# Patient Record
Sex: Female | Born: 1955 | Race: White | Hispanic: No | Smoking: Current every day smoker
Health system: Southern US, Community
[De-identification: ages and names within clinical notes are randomized; demographics above are authoritative.]

## PROBLEM LIST (undated history)

## (undated) DIAGNOSIS — T7840XA Allergy, unspecified, initial encounter: Secondary | ICD-10-CM

## (undated) DIAGNOSIS — F17200 Nicotine dependence, unspecified, uncomplicated: Secondary | ICD-10-CM

## (undated) DIAGNOSIS — F32A Depression, unspecified: Secondary | ICD-10-CM

## (undated) DIAGNOSIS — R87619 Unspecified abnormal cytological findings in specimens from cervix uteri: Secondary | ICD-10-CM

## (undated) DIAGNOSIS — Z86010 Personal history of colonic polyps: Secondary | ICD-10-CM

## (undated) DIAGNOSIS — D219 Benign neoplasm of connective and other soft tissue, unspecified: Secondary | ICD-10-CM

## (undated) DIAGNOSIS — F419 Anxiety disorder, unspecified: Secondary | ICD-10-CM

## (undated) DIAGNOSIS — F329 Major depressive disorder, single episode, unspecified: Secondary | ICD-10-CM

## (undated) HISTORY — PX: COLONOSCOPY: SHX174

## (undated) HISTORY — DX: Nicotine dependence, unspecified, uncomplicated: F17.200

## (undated) HISTORY — DX: Depression, unspecified: F32.A

## (undated) HISTORY — DX: Unspecified abnormal cytological findings in specimens from cervix uteri: R87.619

## (undated) HISTORY — DX: Personal history of colonic polyps: Z86.010

## (undated) HISTORY — DX: Benign neoplasm of connective and other soft tissue, unspecified: D21.9

## (undated) HISTORY — DX: Major depressive disorder, single episode, unspecified: F32.9

## (undated) HISTORY — DX: Allergy, unspecified, initial encounter: T78.40XA

## (undated) HISTORY — PX: POLYPECTOMY: SHX149

## (undated) HISTORY — DX: Anxiety disorder, unspecified: F41.9

---

## 1987-10-19 HISTORY — PX: CHOLECYSTECTOMY, LAPAROSCOPIC: SHX56

## 2000-04-27 ENCOUNTER — Other Ambulatory Visit: Admission: RE | Admit: 2000-04-27 | Discharge: 2000-04-27 | Payer: Self-pay | Admitting: Obstetrics and Gynecology

## 2000-05-18 DIAGNOSIS — D219 Benign neoplasm of connective and other soft tissue, unspecified: Secondary | ICD-10-CM

## 2000-05-18 HISTORY — DX: Benign neoplasm of connective and other soft tissue, unspecified: D21.9

## 2001-06-13 ENCOUNTER — Other Ambulatory Visit: Admission: RE | Admit: 2001-06-13 | Discharge: 2001-06-13 | Payer: Self-pay | Admitting: Obstetrics and Gynecology

## 2002-06-21 ENCOUNTER — Other Ambulatory Visit: Admission: RE | Admit: 2002-06-21 | Discharge: 2002-06-21 | Payer: Self-pay | Admitting: Obstetrics and Gynecology

## 2002-06-29 ENCOUNTER — Encounter: Payer: Self-pay | Admitting: Obstetrics and Gynecology

## 2002-06-29 ENCOUNTER — Ambulatory Visit (HOSPITAL_COMMUNITY): Admission: RE | Admit: 2002-06-29 | Discharge: 2002-06-29 | Payer: Self-pay | Admitting: Obstetrics and Gynecology

## 2003-09-10 ENCOUNTER — Other Ambulatory Visit: Admission: RE | Admit: 2003-09-10 | Discharge: 2003-09-10 | Payer: Self-pay | Admitting: Obstetrics and Gynecology

## 2004-10-16 ENCOUNTER — Encounter: Admission: RE | Admit: 2004-10-16 | Discharge: 2004-10-16 | Payer: Self-pay | Admitting: Obstetrics and Gynecology

## 2004-10-16 ENCOUNTER — Other Ambulatory Visit: Admission: RE | Admit: 2004-10-16 | Discharge: 2004-10-16 | Payer: Self-pay | Admitting: Obstetrics and Gynecology

## 2005-10-18 HISTORY — PX: FINGER SURGERY: SHX640

## 2005-12-03 ENCOUNTER — Other Ambulatory Visit: Admission: RE | Admit: 2005-12-03 | Discharge: 2005-12-03 | Payer: Self-pay | Admitting: Obstetrics and Gynecology

## 2006-11-09 ENCOUNTER — Ambulatory Visit (HOSPITAL_BASED_OUTPATIENT_CLINIC_OR_DEPARTMENT_OTHER): Admission: RE | Admit: 2006-11-09 | Discharge: 2006-11-09 | Payer: Self-pay | Admitting: Orthopedic Surgery

## 2006-12-27 ENCOUNTER — Other Ambulatory Visit: Admission: RE | Admit: 2006-12-27 | Discharge: 2006-12-27 | Payer: Self-pay | Admitting: Obstetrics & Gynecology

## 2007-01-05 ENCOUNTER — Encounter: Admission: RE | Admit: 2007-01-05 | Discharge: 2007-01-05 | Payer: Self-pay | Admitting: Obstetrics and Gynecology

## 2008-01-12 ENCOUNTER — Other Ambulatory Visit: Admission: RE | Admit: 2008-01-12 | Discharge: 2008-01-12 | Payer: Self-pay | Admitting: Obstetrics and Gynecology

## 2008-02-08 ENCOUNTER — Encounter: Admission: RE | Admit: 2008-02-08 | Discharge: 2008-02-08 | Payer: Self-pay | Admitting: Obstetrics and Gynecology

## 2009-01-13 ENCOUNTER — Other Ambulatory Visit: Admission: RE | Admit: 2009-01-13 | Discharge: 2009-01-13 | Payer: Self-pay | Admitting: Obstetrics and Gynecology

## 2009-12-16 ENCOUNTER — Ambulatory Visit: Payer: Self-pay | Admitting: Diagnostic Radiology

## 2009-12-16 ENCOUNTER — Emergency Department (HOSPITAL_BASED_OUTPATIENT_CLINIC_OR_DEPARTMENT_OTHER): Admission: EM | Admit: 2009-12-16 | Discharge: 2009-12-16 | Payer: Self-pay | Admitting: Emergency Medicine

## 2010-02-23 ENCOUNTER — Encounter: Admission: RE | Admit: 2010-02-23 | Discharge: 2010-02-23 | Payer: Self-pay | Admitting: Obstetrics and Gynecology

## 2010-02-26 ENCOUNTER — Encounter: Admission: RE | Admit: 2010-02-26 | Discharge: 2010-02-26 | Payer: Self-pay | Admitting: Obstetrics and Gynecology

## 2011-01-11 LAB — DIFFERENTIAL
Basophils Absolute: 0.1 10*3/uL (ref 0.0–0.1)
Basophils Relative: 2 % — ABNORMAL HIGH (ref 0–1)
Eosinophils Absolute: 0.2 10*3/uL (ref 0.0–0.7)
Eosinophils Relative: 4 % (ref 0–5)
Lymphocytes Relative: 33 % (ref 12–46)
Lymphs Abs: 1.9 10*3/uL (ref 0.7–4.0)
Monocytes Absolute: 0.2 10*3/uL (ref 0.1–1.0)
Monocytes Relative: 4 % (ref 3–12)
Neutro Abs: 3.5 10*3/uL (ref 1.7–7.7)
Neutrophils Relative %: 58 % (ref 43–77)

## 2011-01-11 LAB — CBC
HCT: 42.7 % (ref 36.0–46.0)
Hemoglobin: 14.8 g/dL (ref 12.0–15.0)
MCHC: 34.7 g/dL (ref 30.0–36.0)
MCV: 90.5 fL (ref 78.0–100.0)
Platelets: 260 10*3/uL (ref 150–400)
RBC: 4.71 MIL/uL (ref 3.87–5.11)
RDW: 12 % (ref 11.5–15.5)
WBC: 5.9 10*3/uL (ref 4.0–10.5)

## 2011-01-11 LAB — BASIC METABOLIC PANEL
BUN: 8 mg/dL (ref 6–23)
CO2: 28 mEq/L (ref 19–32)
Calcium: 9.7 mg/dL (ref 8.4–10.5)
Chloride: 108 mEq/L (ref 96–112)
Creatinine, Ser: 0.9 mg/dL (ref 0.4–1.2)
GFR calc Af Amer: >60 mL/min (ref >60)
GFR calc non Af Amer: >60 mL/min (ref >60)
Glucose, Bld: 99 mg/dL (ref 70–99)
Potassium: 3.6 mEq/L (ref 3.5–5.1)
Sodium: 144 mEq/L (ref 135–145)

## 2011-01-11 LAB — POCT CARDIAC MARKERS
CKMB, poc: <1 ng/mL — ABNORMAL LOW (ref 1.0–8.0)
CKMB, poc: <1 ng/mL — ABNORMAL LOW (ref 1.0–8.0)
Myoglobin, poc: 28.3 ng/mL (ref 12–200)
Myoglobin, poc: 30.2 ng/mL (ref 12–200)
Troponin i, poc: <0.05 ng/mL (ref 0.00–0.09)
Troponin i, poc: <0.05 ng/mL (ref 0.00–0.09)

## 2011-01-11 LAB — D-DIMER, QUANTITATIVE: D-Dimer, Quant: <0.22 ug/mL-FEU (ref 0.00–0.48)

## 2011-03-05 NOTE — Op Note (Signed)
Cassandra Lynch, Cassandra Lynch             ACCOUNT NO.:  [id number]   MEDICAL RECORD NO.:  [id number]          PATIENT TYPE:  AMB   LOCATION:  DSC                          FACILITY:  [facility]   PHYSICIAN:  Cindee Salt, M.D.       DATE OF BIRTH:  Mar 13, 1956   DATE OF PROCEDURE:  11/09/2006  DATE OF DISCHARGE:                               OPERATIVE REPORT   PREOPERATIVE DIAGNOSIS:  Laceration, mallet deformity, right index  finger.   POSTOPERATIVE DIAGNOSIS:  Laceration, mallet deformity, right index  finger.   OPERATION:  Repair of extensor tendon, pinning of distal interphalangeal  joint, right index finger.   SURGEON:  Merlyn Lot, M.D.   ASSISTANT:  Carolyne Fiscal R.N.   ANESTHESIA:  Forearm-based IV regional.   HISTORY:  The patient is a 55 year old female who suffered a laceration  over the middle phalanx of her right index finger with a subsequent  mallet deformity.  She is admitted for pinning and repair the extensor  tendon.  In the preoperative area, the patient is seen, the finger  marked by both patient and surgeon, questions encouraged and answered.   PROCEDURE:  The patient was brought to the operating room where a  forearm-based IV regional anesthetic was carried out without difficulty.  She was prepped using DuraPrep, supine position, right arm free.  The  laceration was opened, extended proximally and distally.  Laceration of  the extensor tendon was visualized.  The distal interphalangeal joint  was then pinned with a 3.5 K-wire under image intensification.  This was  done after irrigation of the joint.  The extensor tendon was then  repaired with figure-of-eight 5-0 Mersilene sutures.  The skin was  repaired with interrupted 5-0 nylon sutures.  Sterile compressive  dressing and splint to the finger applied.  The patient tolerated the  procedure well and was taken to the recovery room for observation in  satisfactory condition.  She is discharged home to return to the [facility] in  [place] in 1 week on Vicodin.           ______________________________  Cindee Salt, M.D.     GK/MEDQ  D:  11/09/2006  T:  11/09/2006  Job:  [id number]

## 2011-04-12 ENCOUNTER — Ambulatory Visit
Admission: RE | Admit: 2011-04-12 | Discharge: 2011-04-12 | Disposition: A | Discharge status: Home / Self Care | Payer: BC Managed Care – PPO | Source: Ambulatory Visit | Attending: Obstetrics and Gynecology | Admitting: Obstetrics and Gynecology

## 2011-04-12 ENCOUNTER — Other Ambulatory Visit: Payer: Self-pay | Admitting: Obstetrics and Gynecology

## 2011-04-12 DIAGNOSIS — Z1231 Encounter for screening mammogram for malignant neoplasm of breast: Secondary | ICD-10-CM

## 2012-06-16 LAB — HM PAP SMEAR: HM Pap smear: NEGATIVE

## 2013-06-19 ENCOUNTER — Encounter: Payer: Self-pay | Admitting: Nurse Practitioner

## 2013-06-19 ENCOUNTER — Ambulatory Visit: Payer: Self-pay | Admitting: Nurse Practitioner

## 2013-07-13 ENCOUNTER — Ambulatory Visit (INDEPENDENT_AMBULATORY_CARE_PROVIDER_SITE_OTHER): Payer: BC Managed Care – PPO | Admitting: Nurse Practitioner

## 2013-07-13 ENCOUNTER — Encounter: Payer: Self-pay | Admitting: Nurse Practitioner

## 2013-07-13 VITALS — BP 142/88 | HR 92 | Resp 16 | Ht 64.75 in | Wt 165.0 lb

## 2013-07-13 DIAGNOSIS — Z01419 Encounter for gynecological examination (general) (routine) without abnormal findings: Secondary | ICD-10-CM

## 2013-07-13 DIAGNOSIS — Z0000 Encounter for general adult medical examination without abnormal findings: Secondary | ICD-10-CM

## 2013-07-13 LAB — POCT URINALYSIS DIPSTICK
Bilirubin, UA: NEGATIVE
Blood, UA: NEGATIVE
Glucose, UA: NEGATIVE
Ketones, UA: NEGATIVE
Leukocytes, UA: NEGATIVE
Nitrite, UA: NEGATIVE
Urobilinogen, UA: NEGATIVE
pH, UA: 5.5

## 2013-07-13 LAB — HEMOGLOBIN, FINGERSTICK: Hemoglobin, fingerstick: 17.2 g/dL — ABNORMAL HIGH (ref 12.0–16.0)

## 2013-07-13 NOTE — Progress Notes (Signed)
Patient ID: Cassandra Lynch, female   DOB: [birth year], 57 y.o.   MRN: [id number] 57 y.o. [id number] Divorced Caucasian Fe here for annual exam.  She and ex husband have now worked out the details of divorce and she is much happier.  Less stress. Daughter is still away at school. She is now living with her sister.  She is working on doing things for herself and plans to quit smoking this year. Not dating.  No LMP recorded. Patient is postmenopausal.          Sexually active: no  The current method of family planning is abstinence.    Exercising: no  The patient does not participate in regular exercise at present. Smoker:  yes  Health Maintenance: Pap:  06/16/12, WNL with negative HR HPV MMG:  04/13/11, BI-Rads 1: negative Colonoscopy:  Never but plans are to get done this year. BMD:   02/08/08, osteopenia TDaP:  10/2006 Labs: HB: 17.2 Urine: negative    reports that she has been smoking Cigarettes.  She has a 30 pack-year smoking history. She has never used smokeless tobacco. She reports that  drinks alcohol. She reports that she does not use illicit drugs.  History reviewed. No pertinent past medical history.  Past Surgical History  Procedure Laterality Date  . Cholecystectomy, laparoscopic    . Cesarean section      x 2  . Finger surgery Right 2007    pointer, severed tendons    Current Outpatient Prescriptions  Medication Sig Dispense Refill  . ALPRAZolam (XANAX) 0.25 MG tablet Take 0.25 mg by mouth as needed for sleep.      . ARIPiprazole (ABILIFY) 5 MG tablet Take 5 mg by mouth daily.      . Desloratadine-Pseudoephedrine (CLARINEX-D 12 HOUR PO) Take 1 tablet by mouth 2 (two) times daily.      Marland Kitchen lamoTRIgine (LAMICTAL) 150 MG tablet Take 150 mg by mouth daily.      Marland Kitchen venlafaxine XR (EFFEXOR-XR) 75 MG 24 hr capsule Take 225 mg by mouth daily.       No current facility-administered medications for this visit.    Family History  Problem Relation Age of Onset  . Colon polyps Father    . Colon polyps Brother     ROS:  Pertinent items are noted in HPI.  Otherwise, a comprehensive ROS was negative.  Exam:   BP 142/88  Pulse 92  Resp 16  Ht 5' 4.75" (1.645 m)  Wt 165 lb (74.844 kg)  BMI 27.66 kg/m2 Height: 5' 4.75" (164.5 cm)  Ht Readings from Last 3 Encounters:  07/13/13 5' 4.75" (1.645 m)    General appearance: alert, cooperative and appears stated age Head: Normocephalic, without obvious abnormality, atraumatic Neck: no adenopathy, supple, symmetrical, trachea midline and thyroid normal to inspection and palpation Lungs: clear to auscultation bilaterally Breasts: normal appearance, no masses or tenderness Heart: regular rate and rhythm Abdomen: soft, non-tender; no masses,  no organomegaly Extremities: extremities normal, atraumatic, no cyanosis or edema Skin: Skin color, texture, turgor normal. No rashes or lesions Lymph nodes: Cervical, supraclavicular, and axillary nodes normal. No abnormal inguinal nodes palpated Neurologic: Grossly normal   Pelvic: External genitalia:  no lesions              Urethra:  normal appearing urethra with no masses, tenderness or lesions              Bartholin's and Skene's: normal  Vagina: normal appearing vagina with normal color and discharge, no lesions              Cervix: anteverted              Pap taken: no Bimanual Exam:  Uterus:  normal size, contour, position, consistency, mobility, non-tender              Adnexa: no mass, fullness, tenderness               Rectovaginal: Confirms               Anus:  normal sphincter tone, no lesions  A:  Well Woman with normal exam  Postmenopausal  Situational stress is improved with divorce  P:   Pap smear as per guidelines   Mammogram due now and will schedule  She will return for fasting labs  Counseled on breast self exam, adequate intake of calcium and vitamin D, diet and exercise, Kegel's exercises return annually or prn  An After Visit Summary was  printed and given to the patient.

## 2013-07-13 NOTE — Patient Instructions (Addendum)

## 2013-07-16 NOTE — Progress Notes (Signed)
Encounter reviewed by Dr. Brook Silva.  

## 2013-07-27 ENCOUNTER — Other Ambulatory Visit: Payer: BC Managed Care – PPO

## 2013-07-31 ENCOUNTER — Other Ambulatory Visit (INDEPENDENT_AMBULATORY_CARE_PROVIDER_SITE_OTHER): Payer: BC Managed Care – PPO

## 2013-07-31 DIAGNOSIS — Z0000 Encounter for general adult medical examination without abnormal findings: Secondary | ICD-10-CM

## 2013-08-01 LAB — COMPREHENSIVE METABOLIC PANEL
ALT: 19 U/L (ref 0–35)
AST: 15 U/L (ref 0–37)
Albumin: 4.3 g/dL (ref 3.5–5.2)
BUN: 11 mg/dL (ref 6–23)
Calcium: 9.7 mg/dL (ref 8.4–10.5)
Chloride: 103 mEq/L (ref 96–112)
Creat: 0.81 mg/dL (ref 0.50–1.10)
Glucose, Bld: 100 mg/dL — ABNORMAL HIGH (ref 70–99)
Potassium: 4.2 mEq/L (ref 3.5–5.3)
Sodium: 138 mEq/L (ref 135–145)
Total Protein: 6.7 g/dL (ref 6.0–8.3)

## 2013-08-01 LAB — LIPID PANEL
Cholesterol: 209 mg/dL — ABNORMAL HIGH (ref 0–200)
HDL: 38 mg/dL — ABNORMAL LOW (ref >39)
LDL Cholesterol: 133 mg/dL — ABNORMAL HIGH (ref 0–99)
Total CHOL/HDL Ratio: 5.5 Ratio
Triglycerides: 192 mg/dL — ABNORMAL HIGH (ref <150)

## 2013-08-01 LAB — VITAMIN D 25 HYDROXY (VIT D DEFICIENCY, FRACTURES): Vit D, 25-Hydroxy: 24 ng/mL — ABNORMAL LOW (ref 30–89)

## 2013-08-06 ENCOUNTER — Telehealth: Payer: Self-pay | Admitting: Nurse Practitioner

## 2013-08-06 NOTE — Telephone Encounter (Signed)
Spoke with pt about lab results. Advised on cholesterol panel, triglycerides, TSH, and Vit D. Advised pt that thyroid is low and she should see her PCP for possible medication and management. Advised pt to take 600-800 IU of Vit D daily. Advised pt that decreasing sweets and starches and daily walking could lower triglycerides. Pt agreeable.

## 2013-08-06 NOTE — Telephone Encounter (Signed)
Patient is calling to check on her blood work results from last Tuesday. Please advise.

## 2013-08-07 ENCOUNTER — Other Ambulatory Visit: Payer: Self-pay

## 2013-08-07 DIAGNOSIS — Z1231 Encounter for screening mammogram for malignant neoplasm of breast: Secondary | ICD-10-CM

## 2013-08-14 ENCOUNTER — Ambulatory Visit (AMBULATORY_SURGERY_CENTER): Payer: Self-pay | Admitting: *Deleted

## 2013-08-14 ENCOUNTER — Encounter: Payer: Self-pay | Admitting: Internal Medicine

## 2013-08-14 VITALS — Ht 64.5 in | Wt 163.2 lb

## 2013-08-14 DIAGNOSIS — Z1211 Encounter for screening for malignant neoplasm of colon: Secondary | ICD-10-CM

## 2013-08-14 MED ORDER — NA SULFATE-K SULFATE-MG SULF 17.5-3.13-1.6 GM/177ML PO SOLN: 1.0000 | Freq: Once | ORAL | Status: DC

## 2013-08-14 NOTE — Progress Notes (Signed)
Denies allergies to eggs or soy products. Denies complications with sedation or anesthesia. 

## 2013-08-15 ENCOUNTER — Telehealth: Payer: Self-pay | Admitting: Internal Medicine

## 2013-08-15 NOTE — Telephone Encounter (Signed)
No coupons available but sample is available.  Sample suprep left at 4th floor desk for pt to pick up.

## 2013-08-21 ENCOUNTER — Ambulatory Visit (AMBULATORY_SURGERY_CENTER): Payer: BC Managed Care – PPO | Admitting: Internal Medicine

## 2013-08-21 ENCOUNTER — Encounter: Payer: Self-pay | Admitting: Internal Medicine

## 2013-08-21 VITALS — BP 127/60 | HR 63 | Temp 97.4°F | Resp 11 | Ht 64.0 in | Wt 163.0 lb

## 2013-08-21 DIAGNOSIS — D126 Benign neoplasm of colon, unspecified: Secondary | ICD-10-CM

## 2013-08-21 DIAGNOSIS — Z1211 Encounter for screening for malignant neoplasm of colon: Secondary | ICD-10-CM

## 2013-08-21 MED ORDER — SODIUM CHLORIDE 0.9 % IV SOLN: 500.0000 mL | INTRAVENOUS | Status: DC

## 2013-08-21 NOTE — Patient Instructions (Addendum)
I found and removed 5 small polyps that look benign. You also have internal hemorrhoids.  I will let you know pathology results and when to have another routine colonoscopy by mail.  If you have hemorrhoid problems (swelling, itching, bleeding) I am able to treat those with an in-office procedure. If you like, please call my office at [phone] to schedule an appointment and I can evaluate you further.  It is the time of year to have a vaccination to prevent the flu (influenza virus). Please have this done through your primary care provider or you can get this done at local pharmacies or the [facility]. It would be very helpful if you notify your primary care provider when and where you had the vaccination given by messaging them in My Chart, leaving a message or faxing the information.  I appreciate the opportunity to care for you. Iva Boop, MD, FACG  YOU HAD AN ENDOSCOPIC PROCEDURE TODAY AT THE [facility]: Refer to the procedure report that was given to you for any specific questions about what was found during the examination.  If the procedure report does not answer your questions, please call your gastroenterologist to clarify.  If you requested that your care partner not be given the details of your procedure findings, then the procedure report has been included in a sealed envelope for you to review at your convenience later.  YOU SHOULD EXPECT: Some feelings of bloating in the abdomen. Passage of more gas than usual.  Walking can help get rid of the air that was put into your GI tract during the procedure and reduce the bloating. If you had a lower endoscopy (such as a colonoscopy or flexible sigmoidoscopy) you may notice spotting of blood in your stool or on the toilet paper. If you underwent a bowel prep for your procedure, then you may not have a normal bowel movement for a few days.  DIET: Your first meal following the procedure should be a light meal and then it  is ok to progress to your normal diet.  A half-sandwich or bowl of soup is an example of a good first meal.  Heavy or fried foods are harder to digest and may make you feel nauseous or bloated.  Likewise meals heavy in dairy and vegetables can cause extra gas to form and this can also increase the bloating.  Drink plenty of fluids but you should avoid alcoholic beverages for 24 hours.  ACTIVITY: Your care partner should take you home directly after the procedure.  You should plan to take it easy, moving slowly for the rest of the day.  You can resume normal activity the day after the procedure however you should NOT DRIVE or use heavy machinery for 24 hours (because of the sedation medicines used during the test).    SYMPTOMS TO REPORT IMMEDIATELY: A gastroenterologist can be reached at any hour.  During normal business hours, 8:30 AM to 5:00 PM Monday through Friday, call [phone].  After hours and on weekends, please call the GI answering service at [phone] who will take a message and have the physician on call contact you.   Following lower endoscopy (colonoscopy or flexible sigmoidoscopy):  Excessive amounts of blood in the stool  Significant tenderness or worsening of abdominal pains  Swelling of the abdomen that is new, acute  Fever of 100F or higher  FOLLOW UP: If any biopsies were taken you will be contacted by phone or by letter within  the next 1-3 weeks.  Call your gastroenterologist if you have not heard about the biopsies in 3 weeks.  Our staff will call the home number listed on your records the next business day following your procedure to check on you and address any questions or concerns that you may have at that time regarding the information given to you following your procedure. This is a courtesy call and so if there is no answer at the home number and we have not heard from you through the emergency physician on call, we will assume that you have returned to your  regular daily activities without incident.  SIGNATURES/CONFIDENTIALITY: You and/or your care partner have signed paperwork which will be entered into your electronic medical record.  These signatures attest to the fact that that the information above on your After Visit Summary has been reviewed and is understood.  Full responsibility of the confidentiality of this discharge information lies with you and/or your care-partner.   Please hold your Aspirin, Aspirin containing products, and NSAIDS (Advil, Aleve, Ibuprofen) for 1 week- if you need something Tylenol is ok to take  Please read over handouts polyps and hemorrhoids

## 2013-08-21 NOTE — Op Note (Addendum)
[facility] [address] [place], [address]   COLONOSCOPY PROCEDURE REPORT  PATIENT: Cassandra Lynch, Cassandra Lynch  MR#: [id number] BIRTHDATE: 06-24-1956 , 57  yrs. old GENDER: Female ENDOSCOPIST: Iva Boop, MD, [facility] REFERRED [id number] Zachery Dauer, M.D. PROCEDURE DATE:  08/21/2013 PROCEDURE:   Colonoscopy with biopsy and snare polypectomy First Screening Colonoscopy - Avg.  risk and is 50 yrs.  old or older Yes.  Prior Negative Screening - Now for repeat screening. N/A  History of Adenoma - Now for follow-up colonoscopy & has been > or = to 3 yrs.  N/A  Polyps Removed Today? Yes. ASA CLASS:   Class II INDICATIONS:average risk screening and first colonoscopy. MEDICATIONS: propofol (Diprivan) 400mg  IV, MAC sedation, administered by CRNA, and These medications were titrated to patient response per physician's verbal order  DESCRIPTION OF PROCEDURE:   After the risks benefits and alternatives of the procedure were thoroughly explained, informed consent was obtained.  A digital rectal exam revealed no abnormalities of the rectum.   The LB [id number]  endoscope was introduced through the anus and advanced to the cecum, which was identified by both the appendix and ileocecal valve. No adverse events experienced.   The quality of the prep was Suprep good  The instrument was then slowly withdrawn as the colon was fully examined.      COLON FINDINGS: Five semi-pedunculated and sessile polyps measuring 3-7 mm in size were found in the ascending colon(2), transverse colon, sigmoid colon, and rectum.  A polypectomy was performed with cold forceps (transverse), with a cold snare  (ascending and sigmoid)  and using snare cautery rectum).  The resection was complete and the polyp tissue was completely retrieved.   The colon mucosa was otherwise normal.   A right colon retroflexion was performed.  Retroflexed views revealed internal hemorrhoids and hypertrophied papillae.  The time to cecum=1 minutes 41 seconds. Withdrawal time=20 minutes 26 seconds.  The scope was withdrawn and the procedure completed. COMPLICATIONS: There were no complications.  ENDOSCOPIC IMPRESSION: 1.   Five semi-pedunculated and sessile polyps measuring 3-7 mm in size were found in the ascending colon, transverse colon, sigmoid colon, and rectum; polypectomy was performed with cold forceps, with a cold snare and using snare cautery 2.   The colon mucosa was otherwise normal 3.   Internal hemorrhoids and hypertrophied anal papillae  RECOMMENDATIONS: 1.  Hold aspirin, aspirin products, and anti-inflammatory medication for 1 week. 2.  Timing of repeat colonoscopy will be determined by pathology findings.   eSigned:  Iva Boop, MD, [facility] 08/21/2013 9:12 AM Revised: 08/21/2013 9:12 AM  cc: The Patient, Lauro Franklin, FNP and Juluis Rainier, MD    PATIENT NAME:  [name], [name] MR#: [id number]

## 2013-08-21 NOTE — Progress Notes (Signed)
Patient did not experience any of the following events: a burn prior to discharge; a fall within the facility; wrong site/side/patient/procedure/implant event; or a hospital transfer or hospital admission upon discharge from the facility. (G8907) Patient did not have preoperative order for IV antibiotic SSI prophylaxis. (G8918)  

## 2013-08-21 NOTE — Progress Notes (Signed)
Called to room to assist during endoscopic procedure.  Patient ID and intended procedure confirmed with present staff. Received instructions for my participation in the procedure from the performing physician.  

## 2013-08-22 ENCOUNTER — Telehealth: Payer: Self-pay

## 2013-08-22 NOTE — Telephone Encounter (Signed)
Left message on answering machine. 

## 2013-08-27 ENCOUNTER — Encounter: Payer: Self-pay | Admitting: Internal Medicine

## 2013-08-27 ENCOUNTER — Ambulatory Visit
Admission: RE | Admit: 2013-08-27 | Discharge: 2013-08-27 | Disposition: A | Discharge status: Home / Self Care | Payer: BC Managed Care – PPO | Source: Ambulatory Visit

## 2013-08-27 DIAGNOSIS — Z860100 Personal history of colon polyps, unspecified: Secondary | ICD-10-CM | POA: Insufficient documentation

## 2013-08-27 DIAGNOSIS — Z1231 Encounter for screening mammogram for malignant neoplasm of breast: Secondary | ICD-10-CM

## 2013-08-27 HISTORY — DX: Personal history of colon polyps, unspecified: Z86.0100

## 2013-08-27 HISTORY — DX: Personal history of colonic polyps: Z86.010

## 2013-08-27 NOTE — Progress Notes (Signed)
Quick Note:  5 tubular adenomas max 8 mm Repeat colonoscopy 2017 ______

## 2014-03-13 ENCOUNTER — Other Ambulatory Visit: Payer: Self-pay

## 2014-05-06 ENCOUNTER — Telehealth: Payer: Self-pay | Admitting: Nurse Practitioner

## 2014-05-06 NOTE — Telephone Encounter (Signed)
Spoke with patient. Patient states "I have not had sex in five years and now I am having a great deal of trouble. I have been reading on the internet but I know that there are some things that you need to see someone for." Patient requesting morning appointment next Monday. Appointment scheduled for 7/27 at 10:15am with Milford Cage, FNP. Patient agreeable to date and time.  Routing to provider for final review. Patient agreeable to disposition. Will close encounter

## 2014-05-06 NOTE — Telephone Encounter (Signed)
Patient is experiencing pain during sex.

## 2014-05-09 ENCOUNTER — Telehealth: Payer: Self-pay | Admitting: Nurse Practitioner

## 2014-05-09 NOTE — Telephone Encounter (Signed)
Patient canceled her "pain with intercourse" appointment 05/13/14. Patient says she has solved the problem.

## 2014-05-13 ENCOUNTER — Ambulatory Visit: Payer: BC Managed Care – PPO | Admitting: Nurse Practitioner

## 2014-07-15 ENCOUNTER — Encounter: Payer: Self-pay | Admitting: Nurse Practitioner

## 2014-07-15 ENCOUNTER — Ambulatory Visit (INDEPENDENT_AMBULATORY_CARE_PROVIDER_SITE_OTHER): Payer: BC Managed Care – PPO | Admitting: Nurse Practitioner

## 2014-07-15 VITALS — BP 140/86 | HR 84 | Ht 65.0 in | Wt 168.0 lb

## 2014-07-15 DIAGNOSIS — Z0000 Encounter for general adult medical examination without abnormal findings: Secondary | ICD-10-CM

## 2014-07-15 DIAGNOSIS — Z113 Encounter for screening for infections with a predominantly sexual mode of transmission: Secondary | ICD-10-CM

## 2014-07-15 DIAGNOSIS — Z01419 Encounter for gynecological examination (general) (routine) without abnormal findings: Secondary | ICD-10-CM

## 2014-07-15 LAB — POCT URINALYSIS DIPSTICK
Bilirubin, UA: NEGATIVE
Blood, UA: NEGATIVE
GLUCOSE UA: NEGATIVE
Ketones, UA: NEGATIVE
LEUKOCYTES UA: NEGATIVE
Nitrite, UA: NEGATIVE
Protein, UA: NEGATIVE
Urobilinogen, UA: NEGATIVE
pH, UA: 6

## 2014-07-15 NOTE — Progress Notes (Signed)
Patient ID: Cassandra Lynch, female   DOB: [birth year], 58 y.o.   MRN: [id number] 58 y.o. [id number] Divorced Caucasian Fe here for annual exam.   She sees Dr. Drema Dallas at [facility] and labs were repeated end of last year.  New partner briefly this year.  Patient's last menstrual period was 12/17/2007.        Sexually active: yes   The current method of family planning is condom.     Exercising: no  The patient does not participate in regular exercise at present. Smoker:  Yes, 1 ppd  Health Maintenance: Pap:  06/16/12, WNL with negative HR HPV MMG:  07/28/13, BI-Rads 1: negative Colonoscopy:  08/21/13, 5 tubular adenomas max 8 mm, Repeat 2017 BMD:   02/08/08, osteopenia TDaP:  10/2006 Labs:  HB:  17.4, recheck 17.7  (smoker) Urine:  negative   reports that she has been smoking Cigarettes.  She has a 30 pack-year smoking history. She has never used smokeless tobacco. She reports that she does not drink alcohol or use illicit drugs.  Past Medical History  Diagnosis Date  . Fibroids 05/2000  . Depression   . Personal history of colonic adenomas 08/27/2013    Past Surgical History  Procedure Laterality Date  . Cholecystectomy, laparoscopic  1989  . Cesarean section      x 2  . Finger surgery Right 2007    pointer, severed tendons    Current Outpatient Prescriptions  Medication Sig Dispense Refill  . ALPRAZolam (XANAX) 0.25 MG tablet Take 0.25 mg by mouth as needed for sleep.      . ARIPiprazole (ABILIFY) 5 MG tablet Take 5 mg by mouth daily.      . Desloratadine-Pseudoephedrine (CLARINEX-D 12 HOUR PO) Take 1 tablet by mouth 2 (two) times daily.      Marland Kitchen lamoTRIgine (LAMICTAL) 150 MG tablet Take 150 mg by mouth daily.      Marland Kitchen venlafaxine XR (EFFEXOR-XR) 75 MG 24 hr capsule Take 225 mg by mouth daily.       No current facility-administered medications for this visit.    Family History  Problem Relation Age of Onset  . Colon polyps Father   . Colon polyps Brother   . Heart disease Brother   .  Heart disease Mother   . Colon cancer Neg Hx   . Esophageal cancer Neg Hx   . Rectal cancer Neg Hx   . Stomach cancer Neg Hx     ROS:  Pertinent items are noted in HPI.  Otherwise, a comprehensive ROS was negative.  Exam:   BP 140/86  Pulse 84  Ht 5\' 5"  (1.651 m)  Wt 168 lb (76.204 kg)  BMI 27.96 kg/m2  LMP 12/17/2007 Height: 5\' 5"  (165.1 cm)  Ht Readings from Last 3 Encounters:  07/15/14 5\' 5"  (1.651 m)  08/21/13 5\' 4"  (1.626 m)  08/14/13 5' 4.5" (1.638 m)    General appearance: alert, cooperative and appears stated age Head: Normocephalic, without obvious abnormality, atraumatic Neck: no adenopathy, supple, symmetrical, trachea midline and thyroid  Lungs: clear to auscultation bilaterally normal to inspection and palpation Breasts: normal appearance, no masses or tenderness Heart: regular rate and rhythm Abdomen: soft, non-tender; no masses,  no organomegaly Extremities: extremities normal, atraumatic, no cyanosis or edema Skin: Skin color, texture, turgor normal. No rashes or lesions Lymph nodes: Cervical, supraclavicular, and axillary nodes normal. No abnormal inguinal nodes palpated Neurologic: Grossly normal   Pelvic: External genitalia:  no lesions  Urethra:  normal appearing urethra with no masses, tenderness or lesions              Bartholin's and Skene's: normal                 Vagina: normal appearing vagina with normal color and discharge, no lesions              Cervix: anteverted              Pap taken: Yes.   Bimanual Exam:  Uterus:  normal size, contour, position, consistency, mobility, non-tender              Adnexa: no mass, fullness, tenderness               Rectovaginal: Confirms               Anus:  normal sphincter tone, no lesions  A:  Well Woman with normal exam  Postmenopausal - no HRT  History of adenomatous polyps - needs repeat colonoscopy in 2 years  R/O STD's  History of hypercholesterolemia - followed now by PCP  History  of elevated BP - followed by PCP    P:   Reviewed health and wellness pertinent to exam  Pap smear taken today  Mammogram is due 10/15  Follow with labs  Advise her to see PCP about other health issues  Counseled on breast self exam, mammography screening, adequate intake of calcium and vitamin D, diet and exercise return annually or prn  An After Visit Summary was printed and given to the patient.

## 2014-07-15 NOTE — Patient Instructions (Signed)

## 2014-07-16 LAB — STD PANEL
HIV 1&2 Ab, 4th Generation: NONREACTIVE
Hepatitis B Surface Ag: NEGATIVE

## 2014-07-16 LAB — HEMOGLOBIN, FINGERSTICK: Hemoglobin, fingerstick: 17.4 g/dL — ABNORMAL HIGH (ref 12.0–16.0)

## 2014-07-16 NOTE — Progress Notes (Signed)
Encounter reviewed by Dr. Brook Silva.  

## 2014-07-17 LAB — IPS PAP TEST WITH HPV

## 2014-07-17 LAB — IPS N GONORRHOEA AND CHLAMYDIA BY PCR

## 2014-08-05 ENCOUNTER — Other Ambulatory Visit: Payer: Self-pay

## 2014-08-05 DIAGNOSIS — Z1231 Encounter for screening mammogram for malignant neoplasm of breast: Secondary | ICD-10-CM

## 2014-08-19 ENCOUNTER — Encounter: Payer: Self-pay | Admitting: Nurse Practitioner

## 2014-08-28 ENCOUNTER — Ambulatory Visit
Admission: RE | Admit: 2014-08-28 | Discharge: 2014-08-28 | Disposition: A | Discharge status: Home / Self Care | Payer: BC Managed Care – PPO | Source: Ambulatory Visit

## 2014-08-28 ENCOUNTER — Ambulatory Visit: Payer: BC Managed Care – PPO

## 2014-08-28 DIAGNOSIS — Z1231 Encounter for screening mammogram for malignant neoplasm of breast: Secondary | ICD-10-CM

## 2015-01-30 ENCOUNTER — Telehealth: Payer: Self-pay | Admitting: Nurse Practitioner

## 2015-01-30 NOTE — Telephone Encounter (Signed)
Left message on voicemail to call and reschedule cancelled appointment. °

## 2015-07-17 ENCOUNTER — Ambulatory Visit: Payer: BC Managed Care – PPO | Admitting: Nurse Practitioner

## 2015-09-17 ENCOUNTER — Ambulatory Visit (INDEPENDENT_AMBULATORY_CARE_PROVIDER_SITE_OTHER): Payer: 59 | Admitting: Nurse Practitioner

## 2015-09-17 ENCOUNTER — Encounter: Payer: Self-pay | Admitting: Nurse Practitioner

## 2015-09-17 VITALS — BP 136/84 | HR 104 | Ht 64.5 in | Wt 173.0 lb

## 2015-09-17 DIAGNOSIS — Z113 Encounter for screening for infections with a predominantly sexual mode of transmission: Secondary | ICD-10-CM | POA: Diagnosis not present

## 2015-09-17 DIAGNOSIS — Z01419 Encounter for gynecological examination (general) (routine) without abnormal findings: Secondary | ICD-10-CM | POA: Diagnosis not present

## 2015-09-17 DIAGNOSIS — Z0000 Encounter for general adult medical examination without abnormal findings: Secondary | ICD-10-CM | POA: Diagnosis not present

## 2015-09-17 DIAGNOSIS — M8580 Other specified disorders of bone density and structure, unspecified site: Secondary | ICD-10-CM

## 2015-09-17 DIAGNOSIS — R87619 Unspecified abnormal cytological findings in specimens from cervix uteri: Secondary | ICD-10-CM

## 2015-09-17 HISTORY — DX: Unspecified abnormal cytological findings in specimens from cervix uteri: R87.619

## 2015-09-17 NOTE — Progress Notes (Signed)
Patient ID: Cassandra Lynch, female   DOB: 02-Feb-1956, 59 y.o.   MRN: [id number] 59 y.o. [id number] Divorced  Caucasian Fe here for annual exam.  New partner this past year, not dating now. He was dating her and still involved with ex wife.  She feels well otherwise except for a pulled muscle in right groin.  Patient's last menstrual period was 12/17/2007 (approximate).          Sexually active: Yes.    The current method of family planning is post menopausal status.    Exercising: No.  The patient does not participate in regular exercise at present. Smoker:  no  Health Maintenance: Pap: 07/15/14, Negative with neg HR HPV MMG: 08/28/14, BI-Rads 1: Negative - will schedule Colonoscopy:  08/21/13, 5 tubular adenoma, repeat in 3 years; Dr. Carlean Purl BMD:   02/08/08, spine -0.7; hip -1.1 osteopenia TDaP:  10/2006 Labs: Will return for fasting labs  Urine: negative   reports that she has been smoking Cigarettes.  She has a 30 pack-year smoking history. She has never used smokeless tobacco. She reports that she does not drink alcohol or use illicit drugs.  Past Medical History  Diagnosis Date  . Fibroids 05/2000  . Depression   . Personal history of colonic adenomas 08/27/2013    Past Surgical History  Procedure Laterality Date  . Cholecystectomy, laparoscopic  1989  . Cesarean section      x 2  . Finger surgery Right 2007    pointer, severed tendons    Current Outpatient Prescriptions  Medication Sig Dispense Refill  . ALPRAZolam (XANAX) 0.25 MG tablet Take 0.25 mg by mouth as needed for sleep.    . ARIPiprazole (ABILIFY) 5 MG tablet Take 5 mg by mouth daily.    . Desloratadine-Pseudoephedrine (CLARINEX-D 12 HOUR PO) Take 1 tablet by mouth 2 (two) times daily.    Marland Kitchen lamoTRIgine (LAMICTAL) 150 MG tablet Take 150 mg by mouth daily.    Marland Kitchen venlafaxine XR (EFFEXOR-XR) 75 MG 24 hr capsule Take 225 mg by mouth daily.     No current facility-administered medications for this visit.    Family History   Problem Relation Age of Onset  . Colon polyps Father   . Colon polyps Brother   . Heart disease Brother   . Heart disease Mother   . Colon cancer Neg Hx   . Esophageal cancer Neg Hx   . Rectal cancer Neg Hx   . Stomach cancer Neg Hx     ROS:  Pertinent items are noted in HPI.  Otherwise, a comprehensive ROS was negative.  Exam:   BP 136/84 mmHg  Pulse 104  Ht 5' 4.5" (1.638 m)  Wt 173 lb (78.472 kg)  BMI 29.25 kg/m2  LMP 12/17/2007 (Approximate) Height: 5' 4.5" (163.8 cm) Ht Readings from Last 3 Encounters:  09/17/15 5' 4.5" (1.638 m)  07/15/14 5\' 5"  (1.651 m)  08/21/13 5\' 4"  (1.626 m)    General appearance: alert, cooperative and appears stated age Head: Normocephalic, without obvious abnormality, atraumatic Neck: no adenopathy, supple, symmetrical, trachea midline and thyroid normal to inspection and palpation Lungs: clear to auscultation bilaterally Breasts: normal appearance, no masses or tenderness Heart: regular rate and rhythm Abdomen: soft, non-tender; no masses,  no organomegaly Extremities: extremities normal, atraumatic, no cyanosis or edema Skin: Skin color, texture, turgor normal. No rashes or lesions Lymph nodes: Cervical, supraclavicular, and axillary nodes normal. No abnormal inguinal nodes palpated Neurologic: Grossly normal   Pelvic: External genitalia:  no  lesions              Urethra:  normal appearing urethra with no masses, tenderness or lesions              Bartholin's and Skene's: normal                 Vagina: normal appearing vagina with normal color and discharge, no lesions              Cervix: anteverted              Pap taken: Yes.   Bimanual Exam:  Uterus:  normal size, contour, position, consistency, mobility, non-tender              Adnexa: no mass, fullness, tenderness               Rectovaginal: Confirms               Anus:  normal sphincter tone, no lesions  Chaperone present: yes  A:  Well Woman with normal  exam  Postmenopausal - no HRT History of adenomatous polyps - needs repeat colonoscopy in 2017 R/O STD's History of hypercholesterolemia - followed now by PCP History of elevated BP - followed by PCP  History of borderline Osteopenia  History of anxiety and depression   P:   Reviewed health and wellness pertinent to exam  Pap smear as above  Mammogram is due now and will schedule along with BMD  Will return for fasting labs  Counseled on breast self exam, mammography screening, STD prevention, adequate intake of calcium and vitamin D, diet and exercise return annually or prn  An After Visit Summary was printed and given to the patient.

## 2015-09-17 NOTE — Patient Instructions (Signed)

## 2015-09-17 NOTE — Progress Notes (Signed)
Encounter reviewed by Dr. Elana Jian Amundson C. Silva.  

## 2015-09-19 LAB — IPS N GONORRHOEA AND CHLAMYDIA BY PCR

## 2015-09-19 LAB — IPS PAP TEST WITH HPV

## 2015-09-22 ENCOUNTER — Other Ambulatory Visit (INDEPENDENT_AMBULATORY_CARE_PROVIDER_SITE_OTHER): Payer: 59

## 2015-09-22 ENCOUNTER — Telehealth: Payer: Self-pay

## 2015-09-22 DIAGNOSIS — E559 Vitamin D deficiency, unspecified: Secondary | ICD-10-CM

## 2015-09-22 DIAGNOSIS — Z113 Encounter for screening for infections with a predominantly sexual mode of transmission: Secondary | ICD-10-CM

## 2015-09-22 DIAGNOSIS — Z0000 Encounter for general adult medical examination without abnormal findings: Secondary | ICD-10-CM

## 2015-09-22 LAB — CBC WITH DIFFERENTIAL/PLATELET
BASOS ABS: 0.1 10*3/uL (ref 0.0–0.1)
Basophils Relative: 1 % (ref 0–1)
EOS PCT: 4 % (ref 0–5)
Eosinophils Absolute: 0.2 10*3/uL (ref 0.0–0.7)
HEMATOCRIT: 45.6 % (ref 36.0–46.0)
Hemoglobin: 15.9 g/dL — ABNORMAL HIGH (ref 12.0–15.0)
LYMPHS ABS: 1.9 10*3/uL (ref 0.7–4.0)
LYMPHS PCT: 33 % (ref 12–46)
MCH: 31.1 pg (ref 26.0–34.0)
MCHC: 34.9 g/dL (ref 30.0–36.0)
MCV: 89.2 fL (ref 78.0–100.0)
MONO ABS: 0.4 10*3/uL (ref 0.1–1.0)
MPV: 8.8 fL (ref 8.6–12.4)
Monocytes Relative: 6 % (ref 3–12)
Neutro Abs: 3.3 10*3/uL (ref 1.7–7.7)
Neutrophils Relative %: 56 % (ref 43–77)
Platelets: 324 10*3/uL (ref 150–400)
RBC: 5.11 MIL/uL (ref 3.87–5.11)
RDW: 12.8 % (ref 11.5–15.5)
WBC: 5.9 10*3/uL (ref 4.0–10.5)

## 2015-09-22 LAB — COMPREHENSIVE METABOLIC PANEL
ALT: 25 U/L (ref 6–29)
AST: 18 U/L (ref 10–35)
Albumin: 4.4 g/dL (ref 3.6–5.1)
Alkaline Phosphatase: 72 U/L (ref 33–130)
BUN: 10 mg/dL (ref 7–25)
CHLORIDE: 103 mmol/L (ref 98–110)
CO2: 28 mmol/L (ref 20–31)
CREATININE: 0.97 mg/dL (ref 0.50–1.05)
Calcium: 9.7 mg/dL (ref 8.6–10.4)
Glucose, Bld: 101 mg/dL — ABNORMAL HIGH (ref 65–99)
Potassium: 4.3 mmol/L (ref 3.5–5.3)
SODIUM: 140 mmol/L (ref 135–146)
Total Bilirubin: 0.3 mg/dL (ref 0.2–1.2)
Total Protein: 6.6 g/dL (ref 6.1–8.1)

## 2015-09-22 LAB — LIPID PANEL
CHOLESTEROL: 227 mg/dL — AB (ref 125–200)
HDL: 43 mg/dL — ABNORMAL LOW (ref >=46)
LDL Cholesterol: 149 mg/dL — ABNORMAL HIGH (ref <130)
TRIGLYCERIDES: 174 mg/dL — AB (ref <150)
Total CHOL/HDL Ratio: 5.3 Ratio — ABNORMAL HIGH (ref <=5.0)
VLDL: 35 mg/dL — ABNORMAL HIGH (ref <30)

## 2015-09-22 LAB — VITAMIN D 25 HYDROXY (VIT D DEFICIENCY, FRACTURES): Vit D, 25-Hydroxy: 17 ng/mL — ABNORMAL LOW (ref 30–100)

## 2015-09-22 LAB — STD PANEL
HEP B S AG: NEGATIVE
HIV: NONREACTIVE

## 2015-09-22 LAB — TSH: TSH: 3.343 u[IU]/mL (ref 0.350–4.500)

## 2015-09-22 LAB — HEPATITIS C ANTIBODY: HCV Ab: NEGATIVE

## 2015-09-22 NOTE — Telephone Encounter (Signed)
Spoke with patient. Results given as seen below from [place]. All questions answered regarding results and colposcopy. Patient is agreeable and verbalizes understanding. Would like to speak with her son as he would be coming with her to the appointment in regards to a day and time that will be best. Will return call to schedule.

## 2015-09-22 NOTE — Telephone Encounter (Addendum)
-----   Message from Salvadore Dom, MD sent at 09/19/2015  1:32 PM EST ----- Please also inform the patient of the negative GC/chlamydia  Notes Recorded by Salvadore Dom, MD on 09/19/2015 at 1:31 PM Please inform the patient that she has an abnormal pap (LSIL/+HPV) and set her up for a colposcopy.

## 2015-09-23 NOTE — Telephone Encounter (Signed)
Spoke with patient. Patient would like to precede with scheduling her colposcopy. All additional questions answered regarding colposcopy. Patient would like to wait until the first of January as her insurance will be changing. Requesting to see Dr.Miller because her sister sees her. Appointment scheduled for 10/24/2015 at 3:30 pm with Dr.Miller. Patient is agreeable to date and time.  Instructions given. Motrin 800 mg po x , one hour before appointment with food. Make sure to eat a meal before appointment and drink plenty of fluids. Patient verbalized understanding and will call to reschedule if will be on menses or has any concerns regarding pregnancy. Advised will need to cancel within 24 hours or will have $150.00 late cancellation fee placed to account. Patient agreeable and verbalized understanding of all instructions. Order placed for precert.  Routing to provider for final review. Patient agreeable to disposition. Will close encounter.

## 2015-10-03 ENCOUNTER — Telehealth: Payer: Self-pay | Admitting: *Deleted

## 2015-10-03 MED ORDER — VITAMIN D (ERGOCALCIFEROL) 1.25 MG (50000 UNIT) PO CAPS: 50000.0000 [IU] | ORAL_CAPSULE | ORAL | Status: DC

## 2015-10-03 NOTE — Telephone Encounter (Signed)
I have attempted to contact this patient by phone with the following results: left message to return call to [place] at [phone] on answering machine (mobile).  No personal information given.[phone] (Mobile) *Preferred* Pt has been notified of other labs dated same day, I am unable to tell by notes if pt has been made aware of these results.

## 2015-10-03 NOTE — Telephone Encounter (Signed)
Pt notified in result note.  Closing encounter. 

## 2015-10-03 NOTE — Telephone Encounter (Signed)
-----   Message from Kem Boroughs, [place] sent at 09/23/2015  5:49 PM EST ----- Please let pt know that HIV and Hep C is negative as expected.  The rest of STD's with Hep B, STS is also negative.  The Vit D is quite low at 28 - last year at 27.  She will need Vit D per protocol.the lipid panel is all abnormal - total cholesterol is 227 compared to last year at 209.  The triglycerides at 174, HDL at 43, LDL at 149.  She need a dietary consult to review a diet that is better for her. Ask if we can put in a consult visit.  CMP is normal except elevated glucose.  Thyroid and CBC is normal

## 2015-10-15 ENCOUNTER — Telehealth: Payer: Self-pay | Admitting: Obstetrics & Gynecology

## 2015-10-15 NOTE — Telephone Encounter (Signed)
Left message to call Joevanni Roddey at [phone]. 

## 2015-10-15 NOTE — Telephone Encounter (Signed)
Patient canceled her appointment for colpo and would like to reschedule it.

## 2015-10-21 NOTE — Telephone Encounter (Signed)
Left message to call Valisha Heslin at [phone]. 

## 2015-10-21 NOTE — Telephone Encounter (Signed)
Patient returning your call to schedule colpo [phone].

## 2015-10-24 ENCOUNTER — Ambulatory Visit: Payer: 59 | Admitting: Obstetrics & Gynecology

## 2015-10-29 NOTE — Telephone Encounter (Signed)
Spoke with patient. Appointment for colposcopy rescheduled to 11/14/2015 at 3:30 pm with Dr.Miller. Patient is agreeable to date and time.  Routing to provider for final review. Patient agreeable to disposition. Will close encounter.

## 2015-11-05 ENCOUNTER — Other Ambulatory Visit: Payer: Self-pay

## 2015-11-05 ENCOUNTER — Telehealth: Payer: Self-pay | Admitting: Obstetrics & Gynecology

## 2015-11-05 DIAGNOSIS — Z1231 Encounter for screening mammogram for malignant neoplasm of breast: Secondary | ICD-10-CM

## 2015-11-05 NOTE — Telephone Encounter (Signed)
Patient called and cancelled her colposcopy for 11/14/15 with Dr. Sabra Heck. She'd like to reschedule to another day. Routing to triage for rescheduling.

## 2015-11-05 NOTE — Telephone Encounter (Signed)
Spoke with patient. Patient states that she will need to reschedule her colposcopy appointment. Patient is postmenopausal. Appointment rescheduled to 11/21/2015 at 3:30 pm with Dr.Miller. Patient is agreeable to date and time.  Routing to provider for final review. Patient agreeable to disposition. Will close encounter.

## 2015-11-14 ENCOUNTER — Ambulatory Visit: Payer: Self-pay | Admitting: Obstetrics & Gynecology

## 2015-11-21 ENCOUNTER — Ambulatory Visit (INDEPENDENT_AMBULATORY_CARE_PROVIDER_SITE_OTHER): Payer: BLUE CROSS/BLUE SHIELD | Admitting: Obstetrics & Gynecology

## 2015-11-21 ENCOUNTER — Encounter: Payer: Self-pay | Admitting: Obstetrics & Gynecology

## 2015-11-21 ENCOUNTER — Other Ambulatory Visit: Payer: Self-pay

## 2015-11-21 ENCOUNTER — Ambulatory Visit
Admission: RE | Admit: 2015-11-21 | Discharge: 2015-11-21 | Disposition: A | Discharge status: Home / Self Care | Payer: BLUE CROSS/BLUE SHIELD | Source: Ambulatory Visit

## 2015-11-21 DIAGNOSIS — Z1231 Encounter for screening mammogram for malignant neoplasm of breast: Secondary | ICD-10-CM

## 2015-11-21 DIAGNOSIS — B977 Papillomavirus as the cause of diseases classified elsewhere: Secondary | ICD-10-CM | POA: Diagnosis not present

## 2015-11-21 DIAGNOSIS — R87612 Low grade squamous intraepithelial lesion on cytologic smear of cervix (LGSIL): Secondary | ICD-10-CM | POA: Diagnosis not present

## 2015-11-21 NOTE — Patient Instructions (Signed)

## 2015-11-28 NOTE — Progress Notes (Signed)
59 y.o. Divorced Caucasian female here for colposcopy with possible biopsies and/or ECC due to LGSIL Pap obtained 09/17/15.  Pt had new partner last year but is not with him now.  Had full STD testing in November.  H/O normal pap with neg HR HPV 9/15.    Patient's last menstrual period was 12/17/2007 (approximate).          Sexually active: No.  Not currently.   The current method of family planning is post menopausal status.     Patient has been counseled about results and procedure.  Risks and benefits have bene reviewed including immediate and/or delayed bleeding, infection, cervical scaring from procedure, possibility of needing additional follow up as well as treatment.  rare risks of missing a lesion discussed as well.  All questions answered.  Pt ready to proceed.  BP 136/76 mmHg  Pulse 92  Resp 16  Ht 5' 4.5" (1.638 m)  Wt 173 lb (78.472 kg)  BMI 29.25 kg/m2  LMP 12/17/2007 (Approximate)  General appearance: alert, cooperative and appears stated age Head: Normocephalic, without obvious abnormality, atraumatic Neurologic: Grossly normal  Pelvic: External genitalia:  no lesions              Urethra:  normal appearing urethra with no masses, tenderness or lesions              Bartholins and Skenes: normal                 Vagina: normal appearing vagina with normal color and discharge, no lesions              Cervix: no lesions              Pap taken: No.  Speculum placed.  3% acetic acid applied to cervix for >45 seconds.  Cervix visualized with both 7.5X and 15X magnification.  Green filter also used.  Lugols solution was used.  Findings:  No AWE, no decreased staining with Lugol's.  Biopsy:  none.  ECC:  was performed.  Monsel's was not needed.  Excellent hemostasis was present.  Pt tolerated procedure well and all instruments were removed.    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  Plan:  Pathology results will be called  to patient and follow-up planned pending results.

## 2015-12-01 NOTE — Addendum Note (Signed)
Addended by: Megan Salon on: 12/01/2015 04:08 PM   Modules accepted: Orders

## 2015-12-03 LAB — IPS OTHER TISSUE BIOPSY

## 2015-12-11 ENCOUNTER — Telehealth: Payer: Self-pay | Admitting: Emergency Medicine

## 2015-12-11 DIAGNOSIS — IMO0002 Reserved for concepts with insufficient information to code with codable children: Secondary | ICD-10-CM

## 2015-12-11 NOTE — Telephone Encounter (Signed)
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  Needs repeat Pap smear and repeat ECC 6 months.  ECC did not have endocervical glandular tissue present so that is why I will repeat the ECC when I do the Pap.  06 recall.

## 2015-12-11 NOTE — Telephone Encounter (Signed)
Patient notified of results and questions answered.  Patient is scheduled for follow up 06/28/16 at 1530 for pap and ECC.  Will call back with any concerns or insurance changes as needed.   Order placed for precert. Routing to provider for final review. Patient agreeable to disposition. Will close encounter.

## 2015-12-11 NOTE — Telephone Encounter (Deleted)
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  Needs repeat Pap smear and repeat ECC 6 months.  ECC did not have endocervical glandular tissue present so that is why I will repeat the ECC when I do the Pap.  06 recall.

## 2015-12-29 ENCOUNTER — Other Ambulatory Visit: Payer: BLUE CROSS/BLUE SHIELD

## 2015-12-29 DIAGNOSIS — E559 Vitamin D deficiency, unspecified: Secondary | ICD-10-CM

## 2015-12-30 ENCOUNTER — Telehealth: Payer: Self-pay | Admitting: *Deleted

## 2015-12-30 LAB — VITAMIN D 25 HYDROXY (VIT D DEFICIENCY, FRACTURES): VIT D 25 HYDROXY: 19 ng/mL — AB (ref 30–100)

## 2015-12-30 NOTE — Telephone Encounter (Signed)
-----   Message from Kem Boroughs, [place] sent at 12/30/2015  8:19 AM EDT ----- Please notify pt that the Vit D level is still quite low - went from 17 -19.  Please verify that pt is taking RX Vit D weekly - if not or a lot of missed doses needs to do better.  If she is taking then increase RX Vit D to 2 x a week.

## 2015-12-30 NOTE — Telephone Encounter (Signed)
I have attempted to contact this patient by phone with the following results: left message to return call to [place] at [phone] on answering machine (mobile).  Advised call was regarding recent labs.  [phone] (Mobile) *Preferred*

## 2016-02-02 NOTE — Telephone Encounter (Signed)
Pt notified in result note 01/12/16.  Closing encounter.

## 2016-04-05 ENCOUNTER — Other Ambulatory Visit (INDEPENDENT_AMBULATORY_CARE_PROVIDER_SITE_OTHER): Payer: BLUE CROSS/BLUE SHIELD

## 2016-04-05 DIAGNOSIS — E559 Vitamin D deficiency, unspecified: Secondary | ICD-10-CM

## 2016-04-06 ENCOUNTER — Telehealth: Payer: Self-pay | Admitting: *Deleted

## 2016-04-06 DIAGNOSIS — E559 Vitamin D deficiency, unspecified: Secondary | ICD-10-CM

## 2016-04-06 LAB — VITAMIN D 25 HYDROXY (VIT D DEFICIENCY, FRACTURES): Vit D, 25-Hydroxy: 22 ng/mL — ABNORMAL LOW (ref 30–100)

## 2016-04-06 NOTE — Telephone Encounter (Signed)
-----   Message from Kem Boroughs, [place] sent at 04/06/2016  8:15 AM EDT ----- Please call pt with her Vit D results - verify that she has been back on Vit D weekly for past 3 months.  If she has then I want her to increase Vit D RX to 2 times a week and recheck in 3 months. Order is not yet placed to see if she is compliant or not.

## 2016-04-06 NOTE — Telephone Encounter (Signed)
Left voice mail to call back 

## 2016-04-07 MED ORDER — VITAMIN D (ERGOCALCIFEROL) 1.25 MG (50000 UNIT) PO CAPS: 50000.0000 [IU] | ORAL_CAPSULE | ORAL | Status: DC

## 2016-04-07 NOTE — Telephone Encounter (Signed)
Pt notified. Verbalized understanding. Patient has not been taking her vitamin D. She will start by taking 1 capsule once a week for 3 months and then come back for recheck again. Orders and lab appt made.   Mrs. Cassandra Lynch FYI - Encounter closed.

## 2016-06-28 ENCOUNTER — Telehealth: Payer: Self-pay | Admitting: Obstetrics & Gynecology

## 2016-06-28 ENCOUNTER — Ambulatory Visit: Payer: BLUE CROSS/BLUE SHIELD | Admitting: Obstetrics & Gynecology

## 2016-06-28 ENCOUNTER — Encounter: Payer: Self-pay | Admitting: Obstetrics & Gynecology

## 2016-06-28 NOTE — Telephone Encounter (Signed)
Patient called to cancel her 3:30 procedure appointment at 3:00 because she is out of town. She is aware of the fee.

## 2016-07-09 ENCOUNTER — Telehealth: Payer: Self-pay | Admitting: Nurse Practitioner

## 2016-07-09 NOTE — Telephone Encounter (Signed)
Patient canceled her upcoming vitamin d appointment 07/14/16. Patient did not wish to reschedule.

## 2016-07-11 ENCOUNTER — Other Ambulatory Visit: Payer: Self-pay | Admitting: Nurse Practitioner

## 2016-07-12 ENCOUNTER — Telehealth: Payer: Self-pay | Admitting: Obstetrics & Gynecology

## 2016-07-12 NOTE — Telephone Encounter (Signed)
Patient cancelled two appointments in the last 2 weeks. She was scheduled for colposcopy on 06/28/16 and cancelled without rescheduling. Patient scheduled for lab on 07/14/16 and cancelled without rescheduling.  Call to patient to discuss and reschedule if able. Left voicemail to return call.

## 2016-07-12 NOTE — Telephone Encounter (Signed)
Medication refill request: Vitamin D Last AEX:  09-17-15  Next AEX: 09-20-16 Last MMG (if hormonal medication request): 11-24-15 WNL  Refill authorized: please advise

## 2016-07-14 ENCOUNTER — Other Ambulatory Visit: Payer: BLUE CROSS/BLUE SHIELD

## 2016-07-19 ENCOUNTER — Telehealth: Payer: Self-pay | Admitting: *Deleted

## 2016-07-19 NOTE — Telephone Encounter (Signed)
Patient has cancelled two appointments for her Colposcopy. She did not reschedule the last time she cancelled. Please advise on recall status Thanks Cassandra Lynch

## 2016-08-23 NOTE — Telephone Encounter (Signed)
Pt's AEX needs to be changed to me.  Please change appt and call the pt.  Change recall to 12/17.

## 2016-08-24 NOTE — Telephone Encounter (Signed)
Left message for patient to call regarding changing AEX. Patient needs to be scheduled with Dr. Sabra Heck for AEX/PAP/ECC. -eh

## 2016-08-25 NOTE — Telephone Encounter (Signed)
Spoke with patient and set her up for AEX/ PAP/ ECC on 10-27-15 -eh Extended recall till then.

## 2016-09-17 ENCOUNTER — Encounter: Payer: Self-pay | Admitting: Internal Medicine

## 2016-09-20 ENCOUNTER — Ambulatory Visit: Payer: 59 | Admitting: Nurse Practitioner

## 2016-10-20 ENCOUNTER — Telehealth: Payer: Self-pay | Admitting: Obstetrics & Gynecology

## 2016-10-20 NOTE — Telephone Encounter (Signed)
Spoke with patient. Reviewed results from patient's colposcopy on 11/21/2015 as seen below. Advised repeat pap and ECC are recommended. ECC needs to be performed with MD which is how aex was scheduled with Cassandra Lynch. Patient verbalizes understanding. Patient becomes tearful stating "It is not that I do not like Cassandra Lynch, but I have seen Cassandra Lynch for a long time." Patient is upset and worried about not seeing Cassandra Boroughs, Cassandra Lynch. Advised I will review with the provider for additional recommendations and return call. Patient is agreeable.

## 2016-10-20 NOTE — Telephone Encounter (Signed)
Patient rescheduled her appointment for AEX/PAP/ECC with Dr.Miller. Patient is asking why she has to see Dr.Miller for this appointment,  that she normally sees Patty Grubb,FNP for her AEX.

## 2016-10-20 NOTE — Telephone Encounter (Signed)
Reviewed with Kem Boroughs, FNP. Routing to [place] for review and recommendations.

## 2016-10-21 NOTE — Telephone Encounter (Signed)
Can you try and schedule it on a day when Cassandra Lynch and I will both be here and pap can do her AEX and pap and I will do the ECC the same day?  Make sure a recall is placed and that I am aware of the day so I can put it on my reminders list.  Thanks.

## 2016-10-22 NOTE — Telephone Encounter (Signed)
Call to patient. Appointment rescheduled to 11-09-16 at 1pm with Edman Circle FNP. Dr Sabra Heck will see patient at end of appointment for [facility]. Patient agreeable.   Encounter closed.

## 2016-10-22 NOTE — Telephone Encounter (Signed)
I am OK with this plan.

## 2016-10-26 ENCOUNTER — Ambulatory Visit: Payer: Self-pay | Admitting: Obstetrics & Gynecology

## 2016-11-09 ENCOUNTER — Ambulatory Visit: Payer: Self-pay | Admitting: Obstetrics & Gynecology

## 2016-11-09 ENCOUNTER — Ambulatory Visit (INDEPENDENT_AMBULATORY_CARE_PROVIDER_SITE_OTHER): Payer: BLUE CROSS/BLUE SHIELD | Admitting: Obstetrics & Gynecology

## 2016-11-09 ENCOUNTER — Ambulatory Visit (INDEPENDENT_AMBULATORY_CARE_PROVIDER_SITE_OTHER): Payer: BLUE CROSS/BLUE SHIELD | Admitting: Nurse Practitioner

## 2016-11-09 ENCOUNTER — Encounter: Payer: Self-pay | Admitting: Nurse Practitioner

## 2016-11-09 VITALS — BP 122/78 | HR 92 | Ht 65.0 in | Wt 167.0 lb

## 2016-11-09 DIAGNOSIS — E559 Vitamin D deficiency, unspecified: Secondary | ICD-10-CM

## 2016-11-09 DIAGNOSIS — E2839 Other primary ovarian failure: Secondary | ICD-10-CM | POA: Diagnosis not present

## 2016-11-09 DIAGNOSIS — D126 Benign neoplasm of colon, unspecified: Secondary | ICD-10-CM

## 2016-11-09 DIAGNOSIS — Z0000 Encounter for general adult medical examination without abnormal findings: Secondary | ICD-10-CM

## 2016-11-09 DIAGNOSIS — Z01411 Encounter for gynecological examination (general) (routine) with abnormal findings: Secondary | ICD-10-CM | POA: Diagnosis not present

## 2016-11-09 DIAGNOSIS — B977 Papillomavirus as the cause of diseases classified elsewhere: Secondary | ICD-10-CM | POA: Diagnosis not present

## 2016-11-09 DIAGNOSIS — R87612 Low grade squamous intraepithelial lesion on cytologic smear of cervix (LGSIL): Secondary | ICD-10-CM

## 2016-11-09 LAB — POCT URINALYSIS DIPSTICK
BILIRUBIN UA: NEGATIVE
Blood, UA: NEGATIVE
GLUCOSE UA: NEGATIVE
Ketones, UA: NEGATIVE
LEUKOCYTES UA: NEGATIVE
NITRITE UA: NEGATIVE
Protein, UA: NEGATIVE
Urobilinogen, UA: NEGATIVE
pH, UA: 6

## 2016-11-09 NOTE — Progress Notes (Signed)
GYNECOLOGY  VISIT   HPI: 61 y.o. [id number] Divorced Caucasian female who never returned this past year for follow up recommended after her colposcopy in 2/17 for LGSIL pap smear.  LGSIL pap was obtained 12/16.  She did not return for colposocpy until February.  Colposcopy showed no findings but ECC showed CIN 1.  Follow up six month ECC obtained.  Again, she did not return for this.  Denies vaginal bleeding or discharge.  GYNECOLOGIC HISTORY: Patient's last menstrual period was 12/17/2007 (approximate). Contraception: PMP  Patient Active Problem List   Diagnosis Date Noted  . Personal history of colonic adenomas 08/27/2013    Past Medical History:  Diagnosis Date  . Abnormal Pap smear of cervix 09/17/2015   LSIL CIN1 HR HPV:+detected  . Depression   . Fibroids 05/2000  . Personal history of colonic adenomas 08/27/2013    Past Surgical History:  Procedure Laterality Date  . CESAREAN SECTION     x 2  . CHOLECYSTECTOMY, LAPAROSCOPIC  1989  . FINGER SURGERY Right 2007   pointer, severed tendons    MEDS:  Reviewed in EPIC and UTD  ALLERGIES: Biaxin [clarithromycin]; Ceftin [cefuroxime axetil]; and Vancomycin  Family History  Problem Relation Age of Onset  . Colon polyps Father   . Colon polyps Brother   . Heart disease Brother   . Heart disease Mother   . Colon cancer Neg Hx   . Esophageal cancer Neg Hx   . Rectal cancer Neg Hx   . Stomach cancer Neg Hx     SH:  Married, non smoker  ROS  PHYSICAL EXAMINATION:    LMP 12/17/2007 (Approximate)     General appearance: alert, cooperative and appears stated age  Pelvic: External genitalia:  no lesions              Urethra:  normal appearing urethra with no masses, tenderness or lesions              Bartholins and Skenes: normal                 Vagina: normal appearing vagina with normal color and discharge, no lesions              Cervix: no lesions    Pap obtained:  Yes and then ECC was performe              Bimanual  Exam:  Uterus:  normal size, contour, position, consistency, mobility, non-tender              Adnexa: no mass, fullness, tenderness              Anus:  no lesions  Chaperone was present for exam.  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

## 2016-11-09 NOTE — Patient Instructions (Signed)

## 2016-11-09 NOTE — Progress Notes (Signed)
Patient ID: Cassandra Lynch, female   DOB: 27-Sep-1956, 61 y.o.   MRN: [id number]  61 y.o. [id number] Divorced  Caucasian Fe here for annual exam.  No new diagnosis or problems. She did have an abnormal pap last year with LGSIL and colpo on 11/21/15.  She is also for ECC today along with pap by Dr. Sabra Heck.  She has the same partner for a year.  They are doing good and he plans to retire next yr and move to [place].  They went to elementary school together.  Patient's last menstrual period was 12/17/2007 (approximate).          Sexually active: Yes.    The current method of family planning is post menopausal status.    Exercising: Yes.    walking Smoker:  Yes, 1/2 pack per day  Health Maintenance: Pap: 09/17/15, LGSIL with pos HR HPV  11/21/15 Colpo: CIN-I on ECC MMG: 11/21/15, BI-Rads 1: Negative Colonoscopy:  08/21/13, 5 Tubular Adenoma, repeat in 3 years BMD: 02/08/08 T Score, -0.7 Spine / -1.1 Left Femur Neck TDaP:  10/2006 Shingles: Never Hep C and HIV: 09/22/15 Labs: will return for fasting labs  Urine: negative   reports that she has been smoking Cigarettes.  She has a 30.00 pack-year smoking history. She has never used smokeless tobacco. She reports that she does not drink alcohol or use drugs.  Past Medical History:  Diagnosis Date  . Abnormal Pap smear of cervix 09/17/2015   LSIL CIN1 HR HPV:+detected  . Depression   . Fibroids 05/2000  . Personal history of colonic adenomas 08/27/2013    Past Surgical History:  Procedure Laterality Date  . CESAREAN SECTION     x 2  . CHOLECYSTECTOMY, LAPAROSCOPIC  1989  . FINGER SURGERY Right 2007   pointer, severed tendons    Current Outpatient Prescriptions  Medication Sig Dispense Refill  . ALPRAZolam (XANAX) 0.25 MG tablet Take 0.25 mg by mouth as needed for sleep.    . ARIPiprazole (ABILIFY) 2 MG tablet Take 2 mg by mouth daily.   0  . ARIPiprazole (ABILIFY) 5 MG tablet Take 5 mg by mouth daily. Reported on 11/21/2015    .  Desloratadine-Pseudoephedrine (CLARINEX-D 12 HOUR PO) Take 1 tablet by mouth 2 (two) times daily.    Marland Kitchen lamoTRIgine (LAMICTAL) 150 MG tablet Take 150 mg by mouth daily.    Marland Kitchen venlafaxine XR (EFFEXOR-XR) 75 MG 24 hr capsule Take 225 mg by mouth daily.    . Vitamin D, Ergocalciferol, (DRISDOL) 50000 units CAPS capsule TAKE 1 CAPSULE (50,000 UNITS TOTAL) BY MOUTH EVERY 7 (SEVEN) DAYS. 30 capsule 0   No current facility-administered medications for this visit.     Family History  Problem Relation Age of Onset  . Colon polyps Father   . Colon polyps Brother   . Heart disease Brother   . Heart disease Mother   . Colon cancer Neg Hx   . Esophageal cancer Neg Hx   . Rectal cancer Neg Hx   . Stomach cancer Neg Hx     ROS:  Pertinent items are noted in HPI.  Otherwise, a comprehensive ROS was negative.  Exam:   BP 122/78 (BP Location: Right Arm, Patient Position: Sitting, Cuff Size: Large)   Pulse 92   Ht 5\' 5"  (1.651 m)   Wt 167 lb (75.8 kg)   LMP 12/17/2007 (Approximate)   BMI 27.79 kg/m  Height: 5\' 5"  (165.1 cm) Ht Readings from Last 3 Encounters:  11/09/16 5'  5" (1.651 m)  11/21/15 5' 4.5" (1.638 m)  09/17/15 5' 4.5" (1.638 m)    General appearance: alert, cooperative and appears stated age Head: Normocephalic, without obvious abnormality, atraumatic Neck: no adenopathy, supple, symmetrical, trachea midline and thyroid normal to inspection and palpation Lungs: clear to auscultation bilaterally Breasts: normal appearance, no masses or tenderness Heart: regular rate and rhythm Abdomen: soft, non-tender; no masses,  no organomegaly Extremities: extremities normal, atraumatic, no cyanosis or edema Skin: Skin color, texture, turgor normal. No rashes or lesions Lymph nodes: Cervical, supraclavicular, and axillary nodes normal. No abnormal inguinal nodes palpated Neurologic: Grossly normal   Pelvic: exam done by Dr. Sabra Heck as did not want to put KY jelly in the vagina.      A:   Well Woman with normal exam  Postmenopausal - no HRT History of adenomatous polyps - needs repeat colonoscopy now and referral is placed History of hypercholesterolemia - followed now by PCP History of elevated BP - followed by PCP             History of borderline Osteopenia             History of anxiety and depression  History of abnormal pap LGSIL with colpo 11/2015    P:   Reviewed health and wellness pertinent to exam  Pap smear was done by Dr. Sabra Heck at her exam  Will get Vit D and follow with RX if needed  Mammogram is due next month and will schedule along with BMD  She will be returning for fasting labs and will get TDaP at same time on Friday  Counseled on breast self exam, mammography screening, adequate intake of calcium and vitamin D, diet and exercise, Kegel's exercises return annually or prn  An After Visit Summary was printed and given to the patient  Patient of mine from [facility].

## 2016-11-11 ENCOUNTER — Encounter: Payer: Self-pay | Admitting: Obstetrics & Gynecology

## 2016-11-11 LAB — IPS PAP TEST WITH HPV

## 2016-11-11 NOTE — Progress Notes (Signed)
Reviewed personally.  M. Suzanne Devery Murgia, MD.  

## 2016-11-12 ENCOUNTER — Other Ambulatory Visit (INDEPENDENT_AMBULATORY_CARE_PROVIDER_SITE_OTHER): Payer: BLUE CROSS/BLUE SHIELD

## 2016-11-12 DIAGNOSIS — E559 Vitamin D deficiency, unspecified: Secondary | ICD-10-CM

## 2016-11-12 DIAGNOSIS — Z0000 Encounter for general adult medical examination without abnormal findings: Secondary | ICD-10-CM

## 2016-11-12 LAB — CBC
HCT: 47.2 % — ABNORMAL HIGH (ref 35.0–45.0)
Hemoglobin: 16 g/dL — ABNORMAL HIGH (ref 11.7–15.5)
MCH: 30.4 pg (ref 27.0–33.0)
MCHC: 33.9 g/dL (ref 32.0–36.0)
MCV: 89.6 fL (ref 80.0–100.0)
MPV: 8.8 fL (ref 7.5–12.5)
PLATELETS: 316 10*3/uL (ref 140–400)
RBC: 5.27 MIL/uL — AB (ref 3.80–5.10)
RDW: 13.7 % (ref 11.0–15.0)
WBC: 7.3 10*3/uL (ref 3.8–10.8)

## 2016-11-12 LAB — TSH: TSH: 3.48 m[IU]/L

## 2016-11-12 LAB — IPS OTHER TISSUE BIOPSY

## 2016-11-13 LAB — COMPREHENSIVE METABOLIC PANEL
ALBUMIN: 4.4 g/dL (ref 3.6–5.1)
ALK PHOS: 75 U/L (ref 33–130)
ALT: 25 U/L (ref 6–29)
AST: 18 U/L (ref 10–35)
BILIRUBIN TOTAL: 0.4 mg/dL (ref 0.2–1.2)
BUN: 12 mg/dL (ref 7–25)
CALCIUM: 9.8 mg/dL (ref 8.6–10.4)
CO2: 22 mmol/L (ref 20–31)
CREATININE: 0.85 mg/dL (ref 0.50–0.99)
Chloride: 106 mmol/L (ref 98–110)
Glucose, Bld: 102 mg/dL — ABNORMAL HIGH (ref 65–99)
Potassium: 4.6 mmol/L (ref 3.5–5.3)
Sodium: 142 mmol/L (ref 135–146)
Total Protein: 6.9 g/dL (ref 6.1–8.1)

## 2016-11-13 LAB — LIPID PANEL
CHOLESTEROL: 238 mg/dL — AB (ref <200)
HDL: 49 mg/dL — AB (ref >50)
LDL Cholesterol: 162 mg/dL — ABNORMAL HIGH (ref <100)
Total CHOL/HDL Ratio: 4.9 Ratio (ref <5.0)
Triglycerides: 133 mg/dL (ref <150)
VLDL: 27 mg/dL (ref <30)

## 2016-11-13 LAB — HEMOGLOBIN A1C
HEMOGLOBIN A1C: 5.2 % (ref <5.7)
MEAN PLASMA GLUCOSE: 103 mg/dL

## 2016-11-13 LAB — VITAMIN D 25 HYDROXY (VIT D DEFICIENCY, FRACTURES): Vit D, 25-Hydroxy: 19 ng/mL — ABNORMAL LOW (ref 30–100)

## 2016-11-18 ENCOUNTER — Telehealth: Payer: Self-pay | Admitting: *Deleted

## 2016-11-18 NOTE — Telephone Encounter (Signed)
-----   Message from Kem Boroughs, [place] sent at 11/15/2016  8:32 AM EST ----- Please let pt know that Vit D is very low again at 19.  She must go back on RX Vit D weekly. (goal for her is at 54).  The lipid panel with elevated total at 238 and LDL at 162.  She must go back on a low cholesterol diet -does she need a dietary consult? CMP normal with glucose slight elevated -but non fasting. HGB AIC was great at 5.2  TSH & CBC is normal.

## 2016-11-18 NOTE — Telephone Encounter (Signed)
I have attempted to contact this patient by phone with the following results: left message to return call to [place] at [phone] on answering machine (mobile per [facility]).  Advised call was regarding recent blood work. [phone] (Mobile) *Preferred*

## 2016-11-22 MED ORDER — VITAMIN D (ERGOCALCIFEROL) 1.25 MG (50000 UNIT) PO CAPS: 50000.0000 [IU] | ORAL_CAPSULE | ORAL | 0 refills | Status: DC

## 2016-11-22 NOTE — Telephone Encounter (Signed)
Patient returning your call.

## 2016-11-22 NOTE — Telephone Encounter (Signed)
Pt notified in result note.  Closing encounter. 

## 2016-11-22 NOTE — Telephone Encounter (Signed)
I have attempted to contact this patient by phone with the following results: left message to return call to [place] at [phone] on answering machine (mobile per [facility]). Advised call was regarding recent blood work. [phone] (Mobile) *Preferred*

## 2016-11-23 ENCOUNTER — Telehealth: Payer: Self-pay

## 2016-11-23 NOTE — Telephone Encounter (Addendum)
Spoke with patient. Advised of message as seen below from [place]. Patient has questions regarding percentage of cell change from last pap to this pap. 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 Advised she now has ASCUS changes which is an improvement but that colpo is still recommended based on cell changes. Patient wants to know the "goal" for pap smears. Advised getting back to a normal pap without cell changes and preventing further cell changes from occurring. Advised continued follow up and following recommendations is the best way to ensure cell changes are continually monitored. Patient is agreeable. Colposcopy appointment scheduled for 12/10/2016 at 3:30 pm with Dr.Miller. Patient is agreeable to date and time.  Instructions given. Motrin 800 mg po x , one hour before appointment with food. Make sure to eat a meal before appointment and drink plenty of fluids. Patient agreeable and verbalized understanding of all instructions.   Dr.Miller, do you agree with recommendations given regarding pap comparison?

## 2016-11-23 NOTE — Telephone Encounter (Signed)
Agree with recommendations give.  Ok to close encounter.

## 2016-11-23 NOTE — Telephone Encounter (Signed)
-----   Message from Megan Salon, MD sent at 11/22/2016 12:32 PM EST ----- Please let pt know her pap showed ASCUS findings.  The HPV testing was negative and her ECC did not show any dysplasia.  She does need a repeat colposcopy.  Thanks.

## 2016-11-29 ENCOUNTER — Other Ambulatory Visit: Payer: Self-pay | Admitting: *Deleted

## 2016-11-29 DIAGNOSIS — R87610 Atypical squamous cells of undetermined significance on cytologic smear of cervix (ASC-US): Secondary | ICD-10-CM

## 2016-12-10 ENCOUNTER — Encounter: Payer: Self-pay | Admitting: Obstetrics & Gynecology

## 2016-12-10 ENCOUNTER — Ambulatory Visit (INDEPENDENT_AMBULATORY_CARE_PROVIDER_SITE_OTHER): Payer: BLUE CROSS/BLUE SHIELD | Admitting: Obstetrics & Gynecology

## 2016-12-10 VITALS — BP 116/60 | HR 108 | Resp 16 | Ht 65.0 in | Wt 169.6 lb

## 2016-12-10 DIAGNOSIS — R87610 Atypical squamous cells of undetermined significance on cytologic smear of cervix (ASC-US): Secondary | ICD-10-CM | POA: Diagnosis not present

## 2016-12-10 DIAGNOSIS — N951 Menopausal and female climacteric states: Secondary | ICD-10-CM

## 2016-12-10 DIAGNOSIS — R61 Generalized hyperhidrosis: Secondary | ICD-10-CM

## 2016-12-10 DIAGNOSIS — R232 Flushing: Secondary | ICD-10-CM

## 2016-12-10 NOTE — Progress Notes (Signed)
800 mg of Ibuprofen given to patient pre- procedure. EC, RN.

## 2016-12-10 NOTE — Progress Notes (Signed)
Patient ID: Cassandra Lynch, female   DOB: 01/03/1956, 61 y.o.   MRN: [id number]  61 y.o. G4P2 Divorced Caucasian female here for colposcopy with possible biopsies and/or ECC due to ASCUS Pap without the presence of HR HPV.    Pt had an LGSIL pap 11/21/15.  (She did have a new partner in 2015-2016 time frame but they are no longer together).  Biopsy showed CIN but ECC did not have endocervical cells.  Follow up ECC recommended but this appt was changed and rescheduled by patient several times.  This ultimately ended up being done at her AEX 11/09/16.  Again, no endocervical cell tissue was obtained.  Patient also has complaints of increased hot flashes over the last two years.  She did have hot flashes when she went into menopause over 10 years ago but they subsided after about a year.  Wonders if this is related to menopause.  She is not on HRT.  D/W pt this is unlikely as she went into menopause in her late 20's that this is menopause related.  Reviewed medications and medication side effects.  Could be caused by the lamictal.  Used to see Dr. Caprice Beaver and is now seeing one of the NPs in the office.  She cannot think of the name of the practice or the provider.  Patient's last menstrual period was 12/17/2007 (approximate).          Sexually active: Yes.    The current method of family planning is post menopausal status.     Patient has been counseled about results and procedure.  Risks and benefits have bene reviewed including immediate and/or delayed bleeding, infection, cervical scaring from procedure, possibility of needing additional follow up as well as treatment.  rare risks of missing a lesion discussed as well.  All questions answered.  Pt ready to proceed.  BP 116/60 (BP Location: Right Arm, Patient Position: Sitting, Cuff Size: Normal)   Pulse (!) 108   Resp 16   Ht 5\' 5"  (1.651 m)   Wt 169 lb 9.6 oz (76.9 kg)   LMP 12/17/2007 (Approximate)   BMI 28.22 kg/m   Physical Exam  Constitutional:  She appears well-developed and well-nourished.  Cardiovascular: Normal rate and regular rhythm.   Respiratory: Effort normal and breath sounds normal.  Genitourinary: Vagina normal. There is no rash, tenderness, lesion or injury on the right labia. There is no rash, tenderness, lesion or injury on the left labia.  Lymphadenopathy:       Right: No inguinal adenopathy present.       Left: No inguinal adenopathy present.  Psychiatric: She has a normal mood and affect.    Speculum placed.  3% acetic acid applied to cervix for >45 seconds.  Cervix visualized with both 7.5X and 15X magnification.  Green filter also used.  Lugols solution was used.  Endocervical TZ noted.  Findings:  No AWE or decreased staining of cervix.  Biopsy:  Not obtained.  ECC:  was not performed as this was just done in January, 2018.  Monsel's was not needed.  Excellent hemostasis was present.  Pt tolerated procedure well and all instruments were removed.  Findings noted above on picture of cervix.  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  She will call back and give me name of NP or office (providers that worked with Dr. Caprice Beaver)  to check about the possibility of using gabapentin for hot flashes (which are more likely a side effect of lamotrigine.)  Will need to check with NP to make sure no issues with adding this medication.  Up To Date shows no drug drug interactions but pt aware I am not that familiar with lamotrigine.  ~IN addition to the colposcopy, about 15 minutes spent with patient in discussion of her hot flashes and possible cause/treatment.

## 2016-12-13 ENCOUNTER — Telehealth: Payer: Self-pay | Admitting: *Deleted

## 2016-12-13 NOTE — Telephone Encounter (Signed)
Message left to return call to [place] at [phone].    

## 2016-12-13 NOTE — Telephone Encounter (Signed)
-----   Message from Megan Salon, MD sent at 12/11/2016 11:33 PM EST ----- Regarding: colpo from [facility] [place], Can you call pt and check to see if she can remember name of NP who sees her for psyche issues?  I need to see if can use some gabapentin with her current medicaitons.  I think one of them may be causing her hot flashes and sweating issues.  Also, I told her pap and HPV in 1 year but I'd like to see her in six months for pap and ECC.  Please schedule and put in 06 recall.  Thanks.  Vinnie Level

## 2016-12-13 NOTE — Telephone Encounter (Signed)
Patient returned call. Patient states the NP that she sees is Pauline Good whose office is off of [employer]. Patient states that she has decided to endure the sweating and hot flashes because she does not want to start on the gabapentin at this time. RN advised this message would be sent to Dr. Sabra Heck.   6 month follow up appointment scheduled for Thursday 05/26/17 at 1445. Patient agreeable to date and time of appointment. 06 recall entered in EPIC.   Routing to provider for final review. Patient agreeable to disposition. Will close encounter.

## 2017-02-14 ENCOUNTER — Other Ambulatory Visit (INDEPENDENT_AMBULATORY_CARE_PROVIDER_SITE_OTHER): Payer: BLUE CROSS/BLUE SHIELD

## 2017-02-14 DIAGNOSIS — E559 Vitamin D deficiency, unspecified: Secondary | ICD-10-CM

## 2017-02-15 LAB — VITAMIN D 25 HYDROXY (VIT D DEFICIENCY, FRACTURES): Vit D, 25-Hydroxy: 40 ng/mL (ref 30–100)

## 2017-03-09 ENCOUNTER — Telehealth: Payer: Self-pay | Admitting: *Deleted

## 2017-03-09 NOTE — Telephone Encounter (Signed)
-----   Message from Kem Boroughs, [place] sent at 02/28/2017 12:38 PM EDT -----  Needs Vit d letter ----- Message ----- From: SYSTEM Sent: 02/24/2017  12:07 AM To: Kem Boroughs, FNP

## 2017-03-09 NOTE — Telephone Encounter (Signed)
See note at 12/10/16 and colpo was done but no biopsy and no ECC.  Note says to repeat pap and HPV in 1 year -which would be 10/2017.  08 recall for 10/2017.

## 2017-03-09 NOTE — Telephone Encounter (Signed)
I spoke to the patient to schedule repeat Vit D appointment.  Patient states she does not have insurance and will not be able to come at this time.  While on the phone the patient also cancelled her 05/26/17 6 month pap appointment. Advised that due to previous abnormal it is important for her to keep that appointment. Advised we will call her in August to check on insurance status and schedule appointment.  Patient is agreeable.  Please advise any Vit D follow up.  Order has not been canceled in EPIC.

## 2017-03-09 NOTE — Telephone Encounter (Signed)
Pt just had Vit D done on 02/14/17.  She is OK not to have rechecked for a year.  The AEX can be scheduled anytime this fall or early 2019 as she gets insurance.

## 2017-03-11 NOTE — Telephone Encounter (Signed)
Confirmed with Dr. Sabra Heck that patient is 08 recall for pap and HR HPV testing in 11/2017.  08 Recall for 05/2017 removed.  CC: Edman Circle, FNP

## 2017-05-24 ENCOUNTER — Telehealth: Payer: Self-pay | Admitting: Obstetrics & Gynecology

## 2017-05-24 NOTE — Telephone Encounter (Signed)
Left message regarding upcoming appointment has been canceled and needs to be rescheduled. °

## 2017-05-26 ENCOUNTER — Ambulatory Visit: Payer: Self-pay | Admitting: Obstetrics & Gynecology

## 2017-06-22 ENCOUNTER — Telehealth: Payer: Self-pay | Admitting: *Deleted

## 2017-06-22 NOTE — Telephone Encounter (Signed)
Patient in 06 recall for 05/2017. Patient cancelled appointment due to no insurance. Please advise on recall status/letter Thanks

## 2017-07-19 NOTE — Telephone Encounter (Signed)
She needs a letter and then ok to remove from recall.  H/O CIN 1.

## 2017-07-20 ENCOUNTER — Encounter: Payer: Self-pay | Admitting: *Deleted

## 2017-07-20 NOTE — Telephone Encounter (Signed)
Letter sent - removed from recall -eh 

## 2017-11-11 ENCOUNTER — Ambulatory Visit: Payer: BLUE CROSS/BLUE SHIELD | Admitting: Nurse Practitioner

## 2017-12-20 ENCOUNTER — Telehealth: Payer: Self-pay | Admitting: *Deleted

## 2017-12-20 NOTE — Telephone Encounter (Signed)
Patient in 08 recall for 11/2017. Patient cancelled 11-11-17. Please contact patient to R/S AEX /PAP -eh

## 2017-12-22 NOTE — Telephone Encounter (Signed)
Left voicemail to call back to schedule AEX.  

## 2017-12-26 NOTE — Telephone Encounter (Signed)
Second attempt to contact patient. Left voicemail to call back to schedule AEX.  

## 2017-12-29 NOTE — Telephone Encounter (Signed)
Patient has not returned call regarding recall. Please advise on recall status/ letter Thanks

## 2017-12-30 NOTE — Telephone Encounter (Signed)
Please send pap letter and then remove from recall.  Thanks.

## 2018-01-02 ENCOUNTER — Encounter: Payer: Self-pay | Admitting: *Deleted

## 2018-01-02 NOTE — Telephone Encounter (Signed)
Letter sent, patient removed from recall -eh

## 2018-01-05 ENCOUNTER — Encounter: Payer: Self-pay | Admitting: Internal Medicine

## 2018-01-05 ENCOUNTER — Telehealth: Payer: Self-pay | Admitting: Obstetrics & Gynecology

## 2018-01-05 NOTE — Telephone Encounter (Signed)
Patient called stating that she has not been seen in over a year (last aex 11/09/16) due to loss of insurance. She stated that she was being treated for HPV and wasn't sure if she needed to continue with an office visit or an aex.

## 2018-01-06 NOTE — Telephone Encounter (Signed)
Patient was due for 6 month colposcopy in 11/2016. Patient cancelled this appointment and did not return calls to reschedule. Letter was mailed. Patient was due for aex in 11/2017 and did not schedule, letter was mailed. Patient is calling asking if she needs to proceed with colposocpy or aex, or if she can do them at the same time. Advised will review with Dr.Miller and return call.

## 2018-01-06 NOTE — Telephone Encounter (Signed)
I'd just to the AEX and pap smear testing and then decide if need the colposcopy after the pap results are finalized.

## 2018-01-06 NOTE — Telephone Encounter (Signed)
Spoke with patient. AEX scheduled for 02/02/2018 at 4 pm. Patient decline all appointments offered before then.  Routing to provider for final review. Patient agreeable to disposition. Will close encounter.

## 2018-01-23 ENCOUNTER — Telehealth: Payer: Self-pay | Admitting: *Deleted

## 2018-01-27 NOTE — Telephone Encounter (Signed)
Erroneous encounter

## 2018-02-02 ENCOUNTER — Other Ambulatory Visit (HOSPITAL_COMMUNITY)
Admission: RE | Admit: 2018-02-02 | Discharge: 2018-02-02 | Disposition: A | Discharge status: Home / Self Care | Payer: BLUE CROSS/BLUE SHIELD | Source: Ambulatory Visit | Attending: Obstetrics & Gynecology | Admitting: Obstetrics & Gynecology

## 2018-02-02 ENCOUNTER — Other Ambulatory Visit: Payer: Self-pay

## 2018-02-02 ENCOUNTER — Encounter: Payer: Self-pay | Admitting: Obstetrics & Gynecology

## 2018-02-02 ENCOUNTER — Ambulatory Visit: Payer: BLUE CROSS/BLUE SHIELD | Admitting: Obstetrics & Gynecology

## 2018-02-02 VITALS — BP 136/80 | HR 102 | Resp 14 | Ht 64.25 in | Wt 158.8 lb

## 2018-02-02 DIAGNOSIS — M8580 Other specified disorders of bone density and structure, unspecified site: Secondary | ICD-10-CM | POA: Diagnosis not present

## 2018-02-02 DIAGNOSIS — R87610 Atypical squamous cells of undetermined significance on cytologic smear of cervix (ASC-US): Secondary | ICD-10-CM | POA: Diagnosis not present

## 2018-02-02 DIAGNOSIS — Z124 Encounter for screening for malignant neoplasm of cervix: Secondary | ICD-10-CM

## 2018-02-02 DIAGNOSIS — Z01411 Encounter for gynecological examination (general) (routine) with abnormal findings: Secondary | ICD-10-CM | POA: Diagnosis not present

## 2018-02-02 DIAGNOSIS — Z23 Encounter for immunization: Secondary | ICD-10-CM

## 2018-02-02 DIAGNOSIS — F17200 Nicotine dependence, unspecified, uncomplicated: Secondary | ICD-10-CM | POA: Diagnosis not present

## 2018-02-02 MED ORDER — GABAPENTIN 100 MG PO CAPS: ORAL_CAPSULE | ORAL | 0 refills | Status: DC

## 2018-02-02 MED ORDER — ALPRAZOLAM 0.5 MG PO TABS: 0.5000 mg | ORAL_TABLET | Freq: Every evening | ORAL | 0 refills | Status: DC | PRN

## 2018-02-02 NOTE — Progress Notes (Signed)
62 y.o. [id number] DivorcedCaucasianF here for annual exam.  Doing well.  Going to [place] for about two weeks in May.  Needs tetanus update.  Reviewed CDC recommendations for travel.  Aware needs some vaccination updates.    Still having issues with hot flashes.  Would like to discuss alternative medications other than estrogen.  We discussed this previously.  Reviewed with pt.  She is interested in trying gabapentin.  She confused this with wellbutrin and declined trying this previously.    PCP:  Dr. Drema Dallas.  Declines doing any blood work today as has planned with Dr. Drema Dallas  Patient's last menstrual period was 12/17/2007 (approximate).          Sexually active: No.  The current method of family planning is post menopausal status.    Exercising: Yes.    walking Smoker:  yes  Health Maintenance: Pap:  11/09/16 ASCUS. HR HPV:neg   09/17/15 CIN1. HR HPV:+detected  History of abnormal Pap:  Yes, CIN1 MMG:  11/21/15 BIRADS1:neg.  Aware this is due. Colonoscopy:  2014.  Has this scheduled for June 20th.   BMD:   02/12/08 Osteopenia.  Order place for this to do with MMG. TDaP:  2008.  Pneumonia vaccine(s):  n/a Shingrix:   No Hep C testing: 09/22/15 Neg  Screening Labs: PCP   reports that she has been smoking cigarettes.  She has a 15.00 pack-year smoking history. She has never used smokeless tobacco. She reports that she drinks about 0.6 oz of alcohol per week. She reports that she does not use drugs.  Past Medical History:  Diagnosis Date  . Abnormal Pap smear of cervix 09/17/2015   LSIL CIN1 HR HPV:+detected  . Depression   . Fibroids 05/2000  . Personal history of colonic adenomas 08/27/2013    Past Surgical History:  Procedure Laterality Date  . CESAREAN SECTION     x 2  . CHOLECYSTECTOMY, LAPAROSCOPIC  1989  . FINGER SURGERY Right 2007   pointer, severed tendons    Current Outpatient Medications  Medication Sig Dispense Refill  . ARIPiprazole (ABILIFY) 5 MG tablet Take 5 mg by  mouth daily. Reported on 11/21/2015    . Desloratadine-Pseudoephedrine (CLARINEX-D 12 HOUR PO) Take 1 tablet by mouth 2 (two) times daily.    Marland Kitchen lamoTRIgine (LAMICTAL) 200 MG tablet Take 200 mg by mouth daily.    Marland Kitchen venlafaxine XR (EFFEXOR-XR) 150 MG 24 hr capsule Take 150 mg by mouth daily with breakfast.    . Vitamin D, Ergocalciferol, (DRISDOL) 50000 units CAPS capsule Take 1 capsule (50,000 Units total) by mouth every 7 (seven) days. 12 capsule 0  . ALPRAZolam (XANAX) 0.5 MG tablet Take 1 tablet (0.5 mg total) by mouth at bedtime as needed for anxiety. 30 tablet 0  . gabapentin (NEURONTIN) 100 MG capsule Take 100mg  nightly x 7 nights, then increase to 200mg  nightly x 7 nights and then 300mg  nightly x 7 nights. 63 capsule 0   No current facility-administered medications for this visit.     Family History  Problem Relation Age of Onset  . Colon polyps Father   . Colon polyps Brother   . Heart disease Brother   . Heart disease Mother   . Colon cancer Neg Hx   . Esophageal cancer Neg Hx   . Rectal cancer Neg Hx   . Stomach cancer Neg Hx     Review of Systems  Genitourinary:       Loss of urine with sneeze or cough  Psychiatric/Behavioral: Positive for depression.  All other systems reviewed and are negative.   Exam:   BP 136/80 (BP Location: Left Arm, Patient Position: Sitting, Cuff Size: Normal)   Pulse (!) 102   Resp 14   Ht 5' 4.25" (1.632 m)   Wt 158 lb 12.8 oz (72 kg)   LMP 12/17/2007 (Approximate)   BMI 27.05 kg/m    Height: 5' 4.25" (163.2 cm)  Ht Readings from Last 3 Encounters:  02/02/18 5' 4.25" (1.632 m)  12/10/16 5\' 5"  (1.651 m)  11/09/16 5\' 5"  (1.651 m)    General appearance: alert, cooperative and appears stated age Head: Normocephalic, without obvious abnormality, atraumatic Neck: no adenopathy, supple, symmetrical, trachea midline and thyroid normal to inspection and palpation Lungs: clear to auscultation bilaterally Breasts: normal appearance, no masses  or tenderness Heart: regular rate and rhythm Abdomen: soft, non-tender; bowel sounds normal; no masses,  no organomegaly Extremities: extremities normal, atraumatic, no cyanosis or edema Skin: Skin color, texture, turgor normal. No rashes or lesions Lymph nodes: Cervical, supraclavicular, and axillary nodes normal. No abnormal inguinal nodes palpated Neurologic: Grossly normal   Pelvic: External genitalia:  no lesions              Urethra:  normal appearing urethra with no masses, tenderness or lesions              Bartholins and Skenes: normal                 Vagina: normal appearing vagina with normal color and discharge, no lesions              Cervix: no lesions              Pap taken: Yes.   Bimanual Exam:  Uterus:  normal size, contour, position, consistency, mobility, non-tender              Adnexa: normal adnexa and no mass, fullness, tenderness               Rectovaginal: Confirms               Anus:  normal sphincter tone, no lesions  Chaperone was present for exam.  A:  Well Woman with normal exam PMP, no HRT H/O ASCUS pap with neg HR HPV at last testing.  H/O ASCUS with +HR HPV H/o anxiety and depression, under good control H/O LGSIL pap 2/17, then ASCUS with +HR HPV, then ASCUS with neg HR HPV Upcoming trip to [place]  P:   Mammogram guidelines reviewed.  Aware this is due. Will plan BMD with next MMG.  Order placed.  Pt wants to check coverage. pap smear with HR HPV obtained today Reviewed CDC recommendations for travel to [place] with pt.  Information about travel clinics for vaccinations given today. Tdap updated today Will try gabapentin for hot flashes.  Will start with 100mg  nightly x 7 nights, then increase to 200mg  nightly, then increase to 300mg  nightly.  Side effects, risks reviewed.  Pt will need to give update for RF.  Rx for xanax 0.5mg  po qd prn.  Requested for upcoming trip.  #30/0RF.  Colonoscopy is scheduled for June Declines lab work today Reviewed  Shingrix vaccination with pt as well. return annually or prn

## 2018-02-02 NOTE — Patient Instructions (Addendum)
Call [phone], Monday-Friday for individual appointments.  [facility].   [facility] [phone] [address]  [place], [place] [address]  [facility] [phone] [address]  [address], [place] [address]   [email or web address]  You may want to check about insurance coverage for the shingles vaccines (Shingrix) and also about bone density coverage.

## 2018-02-07 LAB — CYTOLOGY - PAP
ADEQUACY: ABSENT
Diagnosis: NEGATIVE
HPV: NOT DETECTED

## 2018-02-10 ENCOUNTER — Other Ambulatory Visit: Payer: Self-pay | Admitting: Obstetrics & Gynecology

## 2018-02-10 DIAGNOSIS — Z139 Encounter for screening, unspecified: Secondary | ICD-10-CM

## 2018-03-22 ENCOUNTER — Ambulatory Visit: Payer: BLUE CROSS/BLUE SHIELD

## 2018-03-22 ENCOUNTER — Inpatient Hospital Stay: Admission: RE | Admit: 2018-03-22 | Payer: BLUE CROSS/BLUE SHIELD | Source: Ambulatory Visit

## 2018-03-23 ENCOUNTER — Ambulatory Visit (AMBULATORY_SURGERY_CENTER): Payer: Self-pay | Admitting: *Deleted

## 2018-03-23 ENCOUNTER — Other Ambulatory Visit: Payer: Self-pay

## 2018-03-23 ENCOUNTER — Other Ambulatory Visit: Payer: BLUE CROSS/BLUE SHIELD

## 2018-03-23 VITALS — Ht 64.5 in | Wt 159.0 lb

## 2018-03-23 DIAGNOSIS — Z86010 Personal history of colonic polyps: Secondary | ICD-10-CM

## 2018-03-23 NOTE — Progress Notes (Signed)
Eggs makes stomach bloat No soy allergy known to patient  No issues with past sedation with any surgeries  or procedures, no intubation problems  No diet pills per patient No home 02 use per patient  No blood thinners per patient  Pt denies issues with constipation  No A fib or A flutter  EMMI video sent to pt's e mail pt. declined

## 2018-03-24 ENCOUNTER — Encounter: Payer: Self-pay | Admitting: Internal Medicine

## 2018-04-06 ENCOUNTER — Other Ambulatory Visit: Payer: Self-pay

## 2018-04-06 ENCOUNTER — Encounter: Payer: Self-pay | Admitting: Internal Medicine

## 2018-04-06 ENCOUNTER — Ambulatory Visit (AMBULATORY_SURGERY_CENTER): Payer: BLUE CROSS/BLUE SHIELD | Admitting: Internal Medicine

## 2018-04-06 VITALS — BP 133/71 | HR 65 | Temp 98.4°F | Resp 12 | Ht 64.0 in | Wt 158.0 lb

## 2018-04-06 DIAGNOSIS — D123 Benign neoplasm of transverse colon: Secondary | ICD-10-CM | POA: Diagnosis not present

## 2018-04-06 DIAGNOSIS — D125 Benign neoplasm of sigmoid colon: Secondary | ICD-10-CM

## 2018-04-06 DIAGNOSIS — Z860100 Personal history of colon polyps, unspecified: Secondary | ICD-10-CM

## 2018-04-06 DIAGNOSIS — D128 Benign neoplasm of rectum: Secondary | ICD-10-CM

## 2018-04-06 MED ORDER — SODIUM CHLORIDE 0.9 % IV SOLN: 500.0000 mL | Freq: Once | INTRAVENOUS | Status: AC

## 2018-04-06 NOTE — Progress Notes (Signed)
Called to room to assist during endoscopic procedure.  Patient ID and intended procedure confirmed with present staff. Received instructions for my participation in the procedure from the performing physician.  

## 2018-04-06 NOTE — Progress Notes (Signed)
Pt's states no medical or surgical changes since previsit or office visit. 

## 2018-04-06 NOTE — Op Note (Signed)
[place] Patient Name: Cassandra Lynch Procedure Date: 04/06/2018 7:54 AM MRN: [id number] Endoscopist: Gatha Mayer , MD Age: 62 Referring MD:  Date of Birth: [birth year] Gender: Female Account #: [id number] Procedure:                Colonoscopy Indications:              Surveillance: Personal history of adenomatous                            polyps on last colonoscopy > 3 years ago Medicines:                Propofol per Anesthesia, Monitored Anesthesia Care Procedure:                Pre-Anesthesia Assessment:                           - Prior to the procedure, a History and Physical                            was performed, and patient medications and                            allergies were reviewed. The patient's tolerance of                            previous anesthesia was also reviewed. The risks                            and benefits of the procedure and the sedation                            options and risks were discussed with the patient.                            All questions were answered, and informed consent                            was obtained. Prior Anticoagulants: The patient has                            taken no previous anticoagulant or antiplatelet                            agents. ASA Grade Assessment: II - A patient with                            mild systemic disease. After reviewing the risks                            and benefits, the patient was deemed in                            satisfactory condition to undergo the procedure.  After obtaining informed consent, the colonoscope                            was passed under direct vision. Throughout the                            procedure, the patient's blood pressure, pulse, and                            oxygen saturations were monitored continuously. The                            Colonoscope was introduced through the anus and   advanced to the the cecum, identified by                            appendiceal orifice and ileocecal valve. The                            colonoscopy was performed without difficulty. The                            patient tolerated the procedure well. The quality                            of the bowel preparation was good. The appendiceal                            orifice and the rectum were photographed. The bowel                            preparation used was Miralax. Scope In: 8:10:42 AM Scope Out: 8:26:32 AM Scope Withdrawal Time: 0 hours 13 minutes 7 seconds  Total Procedure Duration: 0 hours 15 minutes 50 seconds  Findings:                 The perianal and digital rectal examinations were                            normal.                           Six sessile polyps were found in the rectum,                            sigmoid colon and transverse colon. The polyps were                            3 to 5 mm in size. These polyps were removed with a                            cold snare. Resection and retrieval were complete.  Verification of patient identification for the                            specimen was done. Estimated blood loss was minimal.                           The exam was otherwise without abnormality on                            direct and retroflexion views. Complications:            No immediate complications. Estimated Blood Loss:     Estimated blood loss was minimal. Impression:               - Six 3 to 5 mm polyps in the rectum, in the                            sigmoid colon and in the transverse colon, removed                            with a cold snare. Resected and retrieved.                           - The examination was otherwise normal on direct                            and retroflexion views. Recommendation:           - Patient has a contact number available for                            emergencies. The signs and  symptoms of potential                            delayed complications were discussed with the                            patient. Return to normal activities tomorrow.                            Written discharge instructions were provided to the                            patient.                           - Resume previous diet.                           - Continue present medications.                           - Repeat colonoscopy is recommended for                            surveillance. The colonoscopy date will be  determined after pathology results from today's                            exam become available for review.                           - TRY LOW VOLUME PREP NEXT TIME - SHE VOMITED SOME                            WITH THIS PREP Gatha Mayer, MD 04/06/2018 8:35:07 AM This report has been signed electronically.

## 2018-04-06 NOTE — Progress Notes (Signed)
Report to PACU, RN, vss, BBS= Clear.  

## 2018-04-06 NOTE — Patient Instructions (Addendum)
   I found and removed 6 small polyps. I will let you know pathology results and when to have another routine colonoscopy by mail and/or My Chart.  I appreciate the opportunity to care for you. Gatha Mayer, MD, FACG  YOU HAD AN ENDOSCOPIC PROCEDURE TODAY AT [facility]:   Refer to the procedure report that was given to you for any specific questions about what was found during the examination.  If the procedure report does not answer your questions, please call your gastroenterologist to clarify.  If you requested that your care partner not be given the details of your procedure findings, then the procedure report has been included in a sealed envelope for you to review at your convenience later.  YOU SHOULD EXPECT: Some feelings of bloating in the abdomen. Passage of more gas than usual.  Walking can help get rid of the air that was put into your GI tract during the procedure and reduce the bloating. If you had a lower endoscopy (such as a colonoscopy or flexible sigmoidoscopy) you may notice spotting of blood in your stool or on the toilet paper. If you underwent a bowel prep for your procedure, you may not have a normal bowel movement for a few days.  Please Note:  You might notice some irritation and congestion in your nose or some drainage.  This is from the oxygen used during your procedure.  There is no need for concern and it should clear up in a day or so.  SYMPTOMS TO REPORT IMMEDIATELY:   Following lower endoscopy (colonoscopy or flexible sigmoidoscopy):  Excessive amounts of blood in the stool  Significant tenderness or worsening of abdominal pains  Swelling of the abdomen that is new, acute  Fever of 100F or higher   For urgent or emergent issues, a gastroenterologist can be reached at any hour by calling [phone].   DIET:  We do recommend a small meal at first, but then you may proceed to your regular diet.  Drink plenty of fluids but you  should avoid alcoholic beverages for 24 hours.  ACTIVITY:  You should plan to take it easy for the rest of today and you should NOT DRIVE or use heavy machinery until tomorrow (because of the sedation medicines used during the test).    FOLLOW UP: Our staff will call the number listed on your records the next business day following your procedure to check on you and address any questions or concerns that you may have regarding the information given to you following your procedure. If we do not reach you, we will leave a message.  However, if you are feeling well and you are not experiencing any problems, there is no need to return our call.  We will assume that you have returned to your regular daily activities without incident.  If any biopsies were taken you will be contacted by phone or by letter within the next 1-3 weeks.  Please call us at [phone] if you have not heard about the biopsies in 3 weeks.    SIGNATURES/CONFIDENTIALITY: You and/or your care partner have signed paperwork which will be entered into your electronic medical record.  These signatures attest to the fact that that the information above on your After Visit Summary has been reviewed and is understood.  Full responsibility of the confidentiality of this discharge information lies with you and/or your care-partner.

## 2018-04-07 ENCOUNTER — Telehealth: Payer: Self-pay | Admitting: *Deleted

## 2018-04-07 NOTE — Telephone Encounter (Signed)
  Follow up Call-  Call back number 04/06/2018  Post procedure Call Back phone  # [phone]  Permission to leave phone message Yes  Some recent data might be hidden     Patient questions:  Do you have a fever, pain , or abdominal swelling? No. Pain Score  0 *  Have you tolerated food without any problems? Yes.    Have you been able to return to your normal activities? Yes.    Do you have any questions about your discharge instructions: Diet   No. Medications  No. Follow up visit  No.  Do you have questions or concerns about your Care? No.  Actions: * If pain score is 4 or above: No action needed, pain <4.

## 2018-04-17 ENCOUNTER — Encounter: Payer: Self-pay | Admitting: Internal Medicine

## 2018-04-17 DIAGNOSIS — Z86010 Personal history of colonic polyps: Secondary | ICD-10-CM

## 2018-04-17 NOTE — Progress Notes (Signed)
6 adenomas Recall 2022

## 2018-05-18 ENCOUNTER — Other Ambulatory Visit: Payer: BLUE CROSS/BLUE SHIELD

## 2018-05-18 ENCOUNTER — Ambulatory Visit: Payer: BLUE CROSS/BLUE SHIELD

## 2018-06-22 ENCOUNTER — Ambulatory Visit
Admission: RE | Admit: 2018-06-22 | Discharge: 2018-06-22 | Disposition: A | Discharge status: Home / Self Care | Payer: BLUE CROSS/BLUE SHIELD | Source: Ambulatory Visit | Attending: Obstetrics & Gynecology | Admitting: Obstetrics & Gynecology

## 2018-06-22 DIAGNOSIS — Z139 Encounter for screening, unspecified: Secondary | ICD-10-CM

## 2018-06-22 DIAGNOSIS — M8580 Other specified disorders of bone density and structure, unspecified site: Secondary | ICD-10-CM

## 2020-01-25 ENCOUNTER — Ambulatory Visit: Payer: BLUE CROSS/BLUE SHIELD | Admitting: Obstetrics & Gynecology

## 2020-02-04 ENCOUNTER — Other Ambulatory Visit: Payer: Self-pay

## 2020-02-04 NOTE — Progress Notes (Signed)
64 y.o. [id number] Divorced White or Caucasian female here for annual exam.    Denies vaginal bleeding.    Would like blood work today.  Hasn't had any coffee yet today.    Patient's last menstrual period was 12/17/2007 (approximate).          Sexually active: No.  The current method of family planning is post menopausal status.    Exercising: No.  some walking Smoker:  Yes, 1ppd  Health Maintenance: Pap:02-02-18 Neg:Neg HR HPV, 11-09-16 ASCUS:Pos HR HPV, 09-17-15 CIN 1:Pos.HR HPV History of abnormal Pap:  Yes, Hx CIN 1 MMG: 06-22-18 3D/Neg/density C/BiRads1--patient knows needs to schedule Colonoscopy: 04-06-18 polyps;next 03/2021, three year follow up.   BMD:   06-22-18 mild osteopenia TDaP:  02-02-18 Pneumonia vaccine(s):  no Shingrix:   no Hep C testing: 09-22-15 Neg Screening Labs: done today   reports that she has been smoking cigarettes. She has a 30.00 pack-year smoking history. She has never used smokeless tobacco. She reports current alcohol use of about 1.0 standard drinks of alcohol per week. She reports that she does not use drugs.  Past Medical History:  Diagnosis Date  . Abnormal Pap smear of cervix 09/17/2015   LSIL CIN1 HR HPV:+detected  . Allergy   . Anxiety   . Depression   . Fibroids 05/2000  . Personal history of colonic adenomas 08/27/2013    Past Surgical History:  Procedure Laterality Date  . CESAREAN SECTION     x 2  . CHOLECYSTECTOMY, LAPAROSCOPIC  1989  . COLONOSCOPY    . FINGER SURGERY Right 2007   pointer, severed tendons  . POLYPECTOMY      Current Outpatient Medications  Medication Sig Dispense Refill  . ALPRAZolam (XANAX) 0.5 MG tablet Take 1 tablet (0.5 mg total) by mouth at bedtime as needed for anxiety. 30 tablet 0  . ARIPiprazole (ABILIFY) 5 MG tablet Take 5 mg by mouth daily. Reported on 11/21/2015    . Desloratadine-Pseudoephedrine (CLARINEX-D 12 HOUR PO) Take 1 tablet by mouth 2 (two) times daily.    Marland Kitchen lamoTRIgine (LAMICTAL) 200 MG tablet  Take 200 mg by mouth daily.    Marland Kitchen venlafaxine XR (EFFEXOR-XR) 150 MG 24 hr capsule Take 150 mg by mouth daily with breakfast.     Current Facility-Administered Medications  Medication Dose Route Frequency Provider Last Rate Last Admin  . 0.9 %  sodium chloride infusion  500 mL Intravenous Once Gatha Mayer, MD        Family History  Problem Relation Age of Onset  . Colon polyps Father   . Colon polyps Brother   . Heart disease Brother   . Heart disease Mother   . Colon cancer Neg Hx   . Esophageal cancer Neg Hx   . Rectal cancer Neg Hx   . Stomach cancer Neg Hx     Review of Systems  All other systems reviewed and are negative.   Exam:   BP (!) 148/84   Pulse 76   Temp (!) 97.1 F (36.2 C) (Temporal)   Resp (!) 24   Ht 5' 4.5" (1.638 m)   Wt 159 lb (72.1 kg)   LMP 12/17/2007 (Approximate)   BMI 26.87 kg/m   Height: 5' 4.5" (163.8 cm)  Ht Readings from Last 3 Encounters:  02/05/20 5' 4.5" (1.638 m)  04/06/18 5\' 4"  (1.626 m)  03/23/18 5' 4.5" (1.638 m)    General appearance: alert, cooperative and appears stated age Head: Normocephalic, without obvious abnormality,  atraumatic Neck: no adenopathy, supple, symmetrical, trachea midline and thyroid normal to inspection and palpation Lungs: clear to auscultation bilaterally Breasts: normal appearance, no masses or tenderness Heart: regular rate and rhythm Abdomen: soft, non-tender; bowel sounds normal; no masses,  no organomegaly Extremities: extremities normal, atraumatic, no cyanosis or edema Skin: Skin color, texture, turgor normal. No rashes or lesions Lymph nodes: Cervical, supraclavicular, and axillary nodes normal. No abnormal inguinal nodes palpated Neurologic: Grossly normal   Pelvic: External genitalia:  no lesions              Urethra:  normal appearing urethra with no masses, tenderness or lesions              Bartholins and Skenes: normal                 Vagina: normal appearing vagina with normal  color and discharge, no lesions              Cervix: no lesions              Pap taken: Yes.   Bimanual Exam:  Uterus:  normal size, contour, position, consistency, mobility, non-tender              Adnexa: normal adnexa and no mass, fullness, tenderness               Rectovaginal: Confirms               Anus:  normal sphincter tone, no lesions  Chaperone, Terence Lux, CMA, was present for exam.  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  Then ASCUS pap with neg HR HPV, then neg pap with neg HR HPV. Personal hx of polyps and family hx of polyps Ostoepenia Smoker  P:   Mammogram guidelines reviewed.  Pt is going to call and schedule. pap smear with HR HPV obtained today Colonoscopy 2019 with Dr. Carlean Purl.  She needs to have this repeated in 2022. Consider repeat BMD in 2 years Declines any additional vaccines today CBC, CMP, Lipids, TSH, Vit D and HBA1C obtained today Return annually or prn

## 2020-02-05 ENCOUNTER — Encounter: Payer: Self-pay | Admitting: Obstetrics & Gynecology

## 2020-02-05 ENCOUNTER — Other Ambulatory Visit (HOSPITAL_COMMUNITY)
Admission: RE | Admit: 2020-02-05 | Discharge: 2020-02-05 | Disposition: A | Discharge status: Home / Self Care | Payer: BLUE CROSS/BLUE SHIELD | Source: Ambulatory Visit | Attending: Obstetrics & Gynecology | Admitting: Obstetrics & Gynecology

## 2020-02-05 ENCOUNTER — Ambulatory Visit: Payer: 59 | Admitting: Obstetrics & Gynecology

## 2020-02-05 VITALS — BP 148/84 | HR 76 | Temp 97.1°F | Resp 24 | Ht 64.5 in | Wt 159.0 lb

## 2020-02-05 DIAGNOSIS — Z0000 Encounter for general adult medical examination without abnormal findings: Secondary | ICD-10-CM

## 2020-02-05 DIAGNOSIS — Z124 Encounter for screening for malignant neoplasm of cervix: Secondary | ICD-10-CM | POA: Diagnosis not present

## 2020-02-05 DIAGNOSIS — N870 Mild cervical dysplasia: Secondary | ICD-10-CM

## 2020-02-05 DIAGNOSIS — Z01419 Encounter for gynecological examination (general) (routine) without abnormal findings: Secondary | ICD-10-CM

## 2020-02-06 LAB — CBC
Hematocrit: 45.6 % (ref 34.0–46.6)
Hemoglobin: 15.2 g/dL (ref 11.1–15.9)
MCH: 29.2 pg (ref 26.6–33.0)
MCHC: 33.3 g/dL (ref 31.5–35.7)
MCV: 88 fL (ref 79–97)
Platelets: 334 10*3/uL (ref 150–450)
RBC: 5.21 x10E6/uL (ref 3.77–5.28)
RDW: 12.6 % (ref 11.7–15.4)
WBC: 7.1 10*3/uL (ref 3.4–10.8)

## 2020-02-06 LAB — LIPID PANEL
Chol/HDL Ratio: 3.6 ratio (ref 0.0–4.4)
Cholesterol, Total: 187 mg/dL (ref 100–199)
HDL: 52 mg/dL (ref >39)
LDL Chol Calc (NIH): 109 mg/dL — ABNORMAL HIGH (ref 0–99)
Triglycerides: 145 mg/dL (ref 0–149)
VLDL Cholesterol Cal: 26 mg/dL (ref 5–40)

## 2020-02-06 LAB — COMPREHENSIVE METABOLIC PANEL
ALT: 14 IU/L (ref 0–32)
AST: 14 IU/L (ref 0–40)
Albumin/Globulin Ratio: 2 (ref 1.2–2.2)
Albumin: 4.5 g/dL (ref 3.8–4.8)
Alkaline Phosphatase: 102 IU/L (ref 39–117)
BUN/Creatinine Ratio: 18 (ref 12–28)
BUN: 13 mg/dL (ref 8–27)
Bilirubin Total: 0.2 mg/dL (ref 0.0–1.2)
CO2: 24 mmol/L (ref 20–29)
Calcium: 10.2 mg/dL (ref 8.7–10.3)
Chloride: 103 mmol/L (ref 96–106)
Creatinine, Ser: 0.74 mg/dL (ref 0.57–1.00)
GFR calc Af Amer: 100 mL/min/{1.73_m2} (ref >59)
GFR calc non Af Amer: 86 mL/min/{1.73_m2} (ref >59)
Globulin, Total: 2.2 g/dL (ref 1.5–4.5)
Glucose: 102 mg/dL — ABNORMAL HIGH (ref 65–99)
Potassium: 4.4 mmol/L (ref 3.5–5.2)
Sodium: 140 mmol/L (ref 134–144)
Total Protein: 6.7 g/dL (ref 6.0–8.5)

## 2020-02-06 LAB — HEMOGLOBIN A1C
Est. average glucose Bld gHb Est-mCnc: 108 mg/dL
Hgb A1c MFr Bld: 5.4 % (ref 4.8–5.6)

## 2020-02-06 LAB — VITAMIN D 25 HYDROXY (VIT D DEFICIENCY, FRACTURES): Vit D, 25-Hydroxy: 16.5 ng/mL — ABNORMAL LOW (ref 30.0–100.0)

## 2020-02-06 LAB — TSH: TSH: 3.65 u[IU]/mL (ref 0.450–4.500)

## 2020-02-07 LAB — CYTOLOGY - PAP
Adequacy: ABSENT
Comment: NEGATIVE
Diagnosis: NEGATIVE
High risk HPV: NEGATIVE

## 2020-02-12 ENCOUNTER — Telehealth: Payer: Self-pay

## 2020-02-12 NOTE — Telephone Encounter (Signed)
-----   Message from Megan Salon, MD sent at 02/12/2020 12:29 PM EDT ----- Please let pt know her pap was normal.  02 recall.  Her CBC was normal.  Metabolic panel was normal except glucose was 102.  However, HbA1C was normal at 5.4.  she does not have diabetes.  Vit D was 16.5.  goal is >30.  She should be taking 2000 IU of OTC Vit D daily.  I will recheck this next year.  TSH was normal.  Total cholesterol was 187 with LDLs being a little elevated at 109.  She does smoke.  BP 148/84 day of visit with me which was mildly elevated.  ACC/AHA risk calculation for cardiovascular event in next 10 years is 11.2% risk.  Treatment with lipid lowering agent is recommended.  Her PCP is Dr. Drema Dallas.  She should consider starting a baby ASA (81mg ) as well.  She does need to follow up with her for discussion of treatment options.  Does she want me to send copy of results to Dr. Drema Dallas?

## 2020-02-12 NOTE — Telephone Encounter (Signed)
Left message for call back.

## 2020-02-14 NOTE — Telephone Encounter (Signed)
Patient notified of results. See lab 

## 2020-03-18 ENCOUNTER — Other Ambulatory Visit: Payer: Self-pay | Admitting: Family Medicine

## 2020-03-18 DIAGNOSIS — Z1231 Encounter for screening mammogram for malignant neoplasm of breast: Secondary | ICD-10-CM

## 2020-03-18 DIAGNOSIS — E2839 Other primary ovarian failure: Secondary | ICD-10-CM

## 2020-03-20 ENCOUNTER — Telehealth (HOSPITAL_COMMUNITY): Payer: Self-pay | Admitting: Psychiatry

## 2020-03-20 ENCOUNTER — Other Ambulatory Visit: Payer: Self-pay

## 2020-03-20 ENCOUNTER — Ambulatory Visit (HOSPITAL_COMMUNITY): Payer: Self-pay | Admitting: Clinical

## 2020-03-20 ENCOUNTER — Encounter (HOSPITAL_COMMUNITY): Payer: Self-pay | Admitting: Psychiatry

## 2020-03-20 ENCOUNTER — Telehealth (INDEPENDENT_AMBULATORY_CARE_PROVIDER_SITE_OTHER): Payer: 59 | Admitting: Psychiatry

## 2020-03-20 DIAGNOSIS — F324 Major depressive disorder, single episode, in partial remission: Secondary | ICD-10-CM

## 2020-03-20 DIAGNOSIS — F411 Generalized anxiety disorder: Secondary | ICD-10-CM | POA: Diagnosis not present

## 2020-03-20 MED ORDER — VENLAFAXINE HCL ER 150 MG PO CP24: 150.0000 mg | ORAL_CAPSULE | Freq: Every day | ORAL | 3 refills | Status: DC

## 2020-03-20 MED ORDER — ARIPIPRAZOLE 5 MG PO TABS: 5.0000 mg | ORAL_TABLET | Freq: Every day | ORAL | 3 refills | Status: DC

## 2020-03-20 MED ORDER — LAMOTRIGINE 200 MG PO TABS: 200.0000 mg | ORAL_TABLET | Freq: Every day | ORAL | 3 refills | Status: DC

## 2020-03-20 NOTE — Progress Notes (Signed)
Psychiatric Initial Adult Assessment  Virtual Visit via Video Note  I connected with Cassandra Lynch on 03/20/20 at  8:00 AM EDT by a video enabled telemedicine application and verified that I am speaking with the correct person using two identifiers.  Location: Patient: Home Provider: Clinic   I discussed the limitations of evaluation and management by telemedicine and the availability of in person appointments. The patient expressed understanding and agreed to proceed.  I provided 45 minutes of non-face-to-face time during this encounter.     Patient Identification: Cassandra Lynch MRN:  [id number] Date of Evaluation:  03/20/2020 Referral Source: Beverly Sessions  Chief Complaint:  "Im doing well"  Visit Diagnosis: Major depressive disorder with single episode, in partial remission ([place]) - Plan: ARIPiprazole (ABILIFY) 5 MG tablet, lamoTRIgine (LAMICTAL) 200 MG tablet  Generalized anxiety disorder - Plan: venlafaxine XR (EFFEXOR-XR) 150 MG 24 hr capsule     History of Present Illness: 64 year old female seen today for initial psych evaluation.  Patient was referred to outpatient psychiatry by [facility] for medication management. She has a psychiatric history of anxiety and depression. On exam patient reports that she is doing well on her current medication regimen.  Patient is currently prescribed Lamictal 200 mg daily, Effexor 150 mg daily and Abilify 5 mg daily.  She reports that she was on Xanax 0.5 mg however has not needed it for a while.  She denies symptoms of anxiety and depression.  She endorses adequate sleep stating I sleep wonderfully.  She denies SI/HI/AVH.  At this time patient is not in need of medication adjustments.  She is agreeable to continue medications as prescribed and requested refills.  Provider refilled medications.  No other concerns at this time.  Associated Signs/Symptoms: Depression Symptoms:  Denies  (Hypo) Manic Symptoms:  Denies Anxiety Symptoms:  Denies Psychotic  Symptoms:  Denies PTSD Symptoms: NA  Past Psychiatric History: Anxiety and Depressive  Previous Psychotropic Medications: No   Substance Abuse History in the last 12 months:  No.  Consequences of Substance Abuse: NA  Past Medical History:  Past Medical History:  Diagnosis Date  . Abnormal Pap smear of cervix 09/17/2015   LSIL CIN1 HR HPV:+detected  . Allergy   . Anxiety   . Depression   . Fibroids 05/2000  . Personal history of colonic adenomas 08/27/2013  . Smoker     Past Surgical History:  Procedure Laterality Date  . CESAREAN SECTION     x 2  . CHOLECYSTECTOMY, LAPAROSCOPIC  1989  . COLONOSCOPY    . FINGER SURGERY Right 2007   pointer, severed tendons  . POLYPECTOMY      Family Psychiatric History: Mother depression, sister depression, brother depression   Family History:  Family History  Problem Relation Age of Onset  . Colon polyps Father   . Colon polyps Brother   . Heart disease Brother   . Heart disease Mother   . Colon cancer Neg Hx   . Esophageal cancer Neg Hx   . Rectal cancer Neg Hx   . Stomach cancer Neg Hx     Social History:   Social History   Socioeconomic History  . Marital status: Divorced    Spouse name: Not on file  . Number of children: 2  . Years of education: Not on file  . Highest education level: Not on file  Occupational History  . Not on file  Tobacco Use  . Smoking status: Current Every Day Smoker    Packs/day: 1.00  Years: 30.00    Pack years: 30.00    Types: Cigarettes  . Smokeless tobacco: Never Used  Substance and Sexual Activity  . Alcohol use: Yes    Alcohol/week: 1.0 standard drinks    Types: 1 Glasses of wine per week    Comment: occasionally  . Drug use: No  . Sexual activity: Not Currently    Partners: Male    Birth control/protection: Post-menopausal  Other Topics Concern  . Not on file  Social History Narrative  . Not on file   Social Determinants of Health   Financial Resource Strain:   .  Difficulty of Paying Living Expenses:   Food Insecurity:   . Worried About Charity fundraiser in the Last Year:   . Arboriculturist in the Last Year:   Transportation Needs:   . Film/video editor (Medical):   Marland Kitchen Lack of Transportation (Non-Medical):   Physical Activity:   . Days of Exercise per Week:   . Minutes of Exercise per Session:   Stress:   . Feeling of Stress :   Social Connections:   . Frequency of Communication with Friends and Family:   . Frequency of Social Gatherings with Friends and Family:   . Attends Religious Services:   . Active Member of Clubs or Organizations:   . Attends Archivist Meetings:   Marland Kitchen Marital Status:     Additional Social History: Patient reports that she is divorced. She lives in [place] and he daughter resides with her. She notes that she is retired however notes that she enjoys taking care of her two grandchildren. She denies alcohol or illicit drug uses. She endorses smoking one pack of cigarettes a day.  Allergies:   Allergies  Allergen Reactions  . Biaxin [Clarithromycin] Hives  . Ceftin [Cefuroxime Axetil]   . Vancomycin     Metabolic Disorder Labs: Lab Results  Component Value Date   HGBA1C 5.4 02/05/2020   MPG 103 11/12/2016   No results found for: PROLACTIN Lab Results  Component Value Date   CHOL 187 02/05/2020   TRIG 145 02/05/2020   HDL 52 02/05/2020   CHOLHDL 3.6 02/05/2020   VLDL 27 11/12/2016   LDLCALC 109 (H) 02/05/2020   LDLCALC 162 (H) 11/12/2016   Lab Results  Component Value Date   TSH 3.650 02/05/2020    Therapeutic Level Labs: No results found for: LITHIUM No results found for: CBMZ No results found for: VALPROATE  Current Medications: Current Outpatient Medications  Medication Sig Dispense Refill  . lamoTRIgine (LAMICTAL) 200 MG tablet Take 200 mg by mouth daily.    Marland Kitchen ALPRAZolam (XANAX) 0.5 MG tablet Take 1 tablet (0.5 mg total) by mouth at bedtime as needed for anxiety. 30 tablet 0   . ARIPiprazole (ABILIFY) 5 MG tablet Take 5 mg by mouth daily. Reported on 11/21/2015    . Desloratadine-Pseudoephedrine (CLARINEX-D 12 HOUR PO) Take 1 tablet by mouth 2 (two) times daily.    Marland Kitchen venlafaxine XR (EFFEXOR-XR) 150 MG 24 hr capsule Take 150 mg by mouth daily with breakfast.     Current Facility-Administered Medications  Medication Dose Route Frequency Provider Last Rate Last Admin  . 0.9 %  sodium chloride infusion  500 mL Intravenous Once Gatha Mayer, MD        Musculoskeletal: Strength & Muscle Tone: Unable to assess due to telehealth visit.  Gait & Station: Unable to assess due to telehealth visit.  Patient leans: Right  Psychiatric  Specialty Exam: Review of Systems  Last menstrual period 12/17/2007.There is no height or weight on file to calculate BMI.  General Appearance: Well Groomed  Eye Contact:  Good  Speech:  Clear and Coherent  Volume:  Normal  Mood:  Euthymic  Affect:  Congruent  Thought Process:  Coherent and Linear  Orientation:  Full (Time, Place, and Person)  Thought Content:  WDL and Logical  Suicidal Thoughts:  No  Homicidal Thoughts:  No  Memory:  Immediate;   Good Recent;   Good Remote;   Good  Judgement:  Good  Insight:  Good  Psychomotor Activity:  Normal  Concentration:  Concentration: Good and Attention Span: Good  Recall:  Good  Fund of Knowledge:Good  Language: Good  Akathisia:  No  Handed:  Right  AIMS (if indicated): Did not do   Assets:  Communication Skills Desire for Improvement Housing Social Support  ADL's:  Intact  Cognition: WNL  Sleep:  Good   Screenings:   Assessment and Plan: Patient reports that she is doing well on current medication regimen.  She is agreeable to continue medications as prescribed and requested medication refills.  1. Major depressive disorder with single episode, in partial remission ([facility])  - ARIPiprazole (ABILIFY) 5 MG tablet; Take 1 tablet (5 mg total) by mouth daily. Reported on  11/21/2015  Dispense: 30 tablet; Refill: 3 - lamoTRIgine (LAMICTAL) 200 MG tablet; Take 1 tablet (200 mg total) by mouth daily.  Dispense: 30 tablet; Refill: 3  2. Generalized anxiety disorder  - venlafaxine XR (EFFEXOR-XR) 150 MG 24 hr capsule; Take 1 capsule (150 mg total) by mouth daily with breakfast.  Dispense: 30 capsule; Refill: 3    Follow up in 3 months   Salley Slaughter, NP 6/3/20218:18 AM

## 2020-06-18 ENCOUNTER — Encounter (HOSPITAL_COMMUNITY): Payer: Self-pay | Admitting: Psychiatry

## 2020-06-18 ENCOUNTER — Other Ambulatory Visit: Payer: Self-pay

## 2020-06-18 ENCOUNTER — Telehealth (INDEPENDENT_AMBULATORY_CARE_PROVIDER_SITE_OTHER): Payer: 59 | Admitting: Psychiatry

## 2020-06-18 DIAGNOSIS — F324 Major depressive disorder, single episode, in partial remission: Secondary | ICD-10-CM | POA: Diagnosis not present

## 2020-06-18 DIAGNOSIS — F411 Generalized anxiety disorder: Secondary | ICD-10-CM

## 2020-06-18 MED ORDER — LAMOTRIGINE 200 MG PO TABS: 200.0000 mg | ORAL_TABLET | Freq: Every day | ORAL | 3 refills | Status: DC

## 2020-06-18 MED ORDER — VENLAFAXINE HCL ER 150 MG PO CP24: 150.0000 mg | ORAL_CAPSULE | Freq: Every day | ORAL | 3 refills | Status: DC

## 2020-06-18 MED ORDER — ARIPIPRAZOLE 5 MG PO TABS: 5.0000 mg | ORAL_TABLET | Freq: Every day | ORAL | 3 refills | Status: DC

## 2020-06-18 MED ORDER — ALPRAZOLAM 0.5 MG PO TABS: 0.5000 mg | ORAL_TABLET | Freq: Every evening | ORAL | 0 refills | Status: DC | PRN

## 2020-06-18 NOTE — Progress Notes (Signed)
BH MD/PA/NP OP Progress Note Virtual Visit via Video Note  I connected with Cassandra Lynch on 06/18/20 at  8:30 AM EDT by a video enabled telemedicine application and verified that I am speaking with the correct person using two identifiers.  Location: Patient: Home Provider: Clinic   I discussed the limitations of evaluation and management by telemedicine and the availability of in person appointments. The patient expressed understanding and agreed to proceed.  I provided 30 minutes of non-face-to-face time during this encounter.     06/18/2020 9:12 AM Cassandra Lynch  MRN:  [id number]  Chief Complaint: "Im doing okay" HPI: 64 year old female seen today for follow up psychiatric evaluation. She has a psychiatric history of anxiety and depression. She is currently prescribed Lamictal 200 mg daily, Effexor 150 mg daily and Abilify 5 mg daily.  She reports that she was on Xanax 0.5 mg however has not needed it for a while.    On exam she is well groomed, peasant, and cooperative. Patient was observed with her grandchildren who she notes she is babysitting today. She denies symptoms of anxiety and depression.  She endorses adequate sleep stating I sleep wonderfully.  She denies SI/HI/AVH.    At this time patient is not in need of medication adjustments.  She is agreeable to continue medications as prescribed and requested refills.  Provider refilled medications.  No other concerns at this time.  Visit Diagnosis:    ICD-10-CM   1. Major depressive disorder with single episode, in partial remission (HCC)  F32.4 ARIPiprazole (ABILIFY) 5 MG tablet    lamoTRIgine (LAMICTAL) 200 MG tablet  2. Generalized anxiety disorder  F41.1 ALPRAZolam (XANAX) 0.5 MG tablet    venlafaxine XR (EFFEXOR-XR) 150 MG 24 hr capsule    Past Psychiatric History: Anxiety and depression  Past Medical History:  Past Medical History:  Diagnosis Date  . Abnormal Pap smear of cervix 09/17/2015   LSIL CIN1 HR  HPV:+detected  . Allergy   . Anxiety   . Depression   . Fibroids 05/2000  . Personal history of colonic adenomas 08/27/2013  . Smoker     Past Surgical History:  Procedure Laterality Date  . CESAREAN SECTION     x 2  . CHOLECYSTECTOMY, LAPAROSCOPIC  1989  . COLONOSCOPY    . FINGER SURGERY Right 2007   pointer, severed tendons  . POLYPECTOMY      Family Psychiatric History: Mother depression, sister depression, brother depression   Family History:  Family History  Problem Relation Age of Onset  . Colon polyps Father   . Colon polyps Brother   . Heart disease Brother   . Heart disease Mother   . Colon cancer Neg Hx   . Esophageal cancer Neg Hx   . Rectal cancer Neg Hx   . Stomach cancer Neg Hx     Social History:  Social History   Socioeconomic History  . Marital status: Divorced    Spouse name: Not on file  . Number of children: 2  . Years of education: Not on file  . Highest education level: Not on file  Occupational History  . Not on file  Tobacco Use  . Smoking status: Current Every Day Smoker    Packs/day: 1.00    Years: 30.00    Pack years: 30.00    Types: Cigarettes  . Smokeless tobacco: Never Used  Vaping Use  . Vaping Use: Never used  Substance and Sexual Activity  . Alcohol use: Yes  Alcohol/week: 1.0 standard drink    Types: 1 Glasses of wine per week    Comment: occasionally  . Drug use: No  . Sexual activity: Not Currently    Partners: Male    Birth control/protection: Post-menopausal  Other Topics Concern  . Not on file  Social History Narrative  . Not on file   Social Determinants of Health   Financial Resource Strain:   . Difficulty of Paying Living Expenses: Not on file  Food Insecurity:   . Worried About Charity fundraiser in the Last Year: Not on file  . Ran Out of Food in the Last Year: Not on file  Transportation Needs:   . Lack of Transportation (Medical): Not on file  . Lack of Transportation (Non-Medical): Not on  file  Physical Activity:   . Days of Exercise per Week: Not on file  . Minutes of Exercise per Session: Not on file  Stress:   . Feeling of Stress : Not on file  Social Connections:   . Frequency of Communication with Friends and Family: Not on file  . Frequency of Social Gatherings with Friends and Family: Not on file  . Attends Religious Services: Not on file  . Active Member of Clubs or Organizations: Not on file  . Attends Archivist Meetings: Not on file  . Marital Status: Not on file    Allergies:  Allergies  Allergen Reactions  . Biaxin [Clarithromycin] Hives  . Ceftin [Cefuroxime Axetil]   . Vancomycin     Metabolic Disorder Labs: Lab Results  Component Value Date   HGBA1C 5.4 02/05/2020   MPG 103 11/12/2016   No results found for: PROLACTIN Lab Results  Component Value Date   CHOL 187 02/05/2020   TRIG 145 02/05/2020   HDL 52 02/05/2020   CHOLHDL 3.6 02/05/2020   VLDL 27 11/12/2016   LDLCALC 109 (H) 02/05/2020   LDLCALC 162 (H) 11/12/2016   Lab Results  Component Value Date   TSH 3.650 02/05/2020   TSH 3.48 11/12/2016    Therapeutic Level Labs: No results found for: LITHIUM No results found for: VALPROATE No components found for:  CBMZ  Current Medications: Current Outpatient Medications  Medication Sig Dispense Refill  . ALPRAZolam (XANAX) 0.5 MG tablet Take 1 tablet (0.5 mg total) by mouth at bedtime as needed for anxiety. 30 tablet 0  . ARIPiprazole (ABILIFY) 5 MG tablet Take 1 tablet (5 mg total) by mouth daily. Reported on 11/21/2015 30 tablet 3  . Desloratadine-Pseudoephedrine (CLARINEX-D 12 HOUR PO) Take 1 tablet by mouth 2 (two) times daily.    Marland Kitchen lamoTRIgine (LAMICTAL) 200 MG tablet Take 1 tablet (200 mg total) by mouth daily. 30 tablet 3  . venlafaxine XR (EFFEXOR-XR) 150 MG 24 hr capsule Take 1 capsule (150 mg total) by mouth daily with breakfast. 30 capsule 3   Current Facility-Administered Medications  Medication Dose Route  Frequency Provider Last Rate Last Admin  . 0.9 %  sodium chloride infusion  500 mL Intravenous Once Gatha Mayer, MD         Musculoskeletal: Strength & Muscle Tone: Unable to assess due to telehealth visit [place]: Unable to assess due to telehealth visit Patient leans: N/A  Psychiatric Specialty Exam: Review of Systems  Last menstrual period 12/17/2007.There is no height or weight on file to calculate BMI.  General Appearance: Well Groomed  Eye Contact:  Good  Speech:  Clear and Coherent and Normal Rate  Volume:  Normal  Mood:  Euthymic  Affect:  Appropriate and Congruent  Thought Process:  Coherent, Goal Directed and Linear  Orientation:  Full (Time, Place, and Person)  Thought Content: WDL and Logical   Suicidal Thoughts:  No  Homicidal Thoughts:  No  Memory:  Immediate;   Good Recent;   Good Remote;   Good  Judgement:  Good  Insight:  Good  Psychomotor Activity:  Normal  Concentration:  Concentration: Good and Attention Span: Good  Recall:  Good  Fund of Knowledge: Good  Language: Good  Akathisia:  No  Handed:  Right  AIMS (if indicated): Not Done  Assets:  Communication Skills Desire for Improvement Financial Resources/Insurance Housing Social Support  ADL's:  Intact  Cognition: WNL  Sleep:  Good   Screenings:   Assessment and Plan: Patient reports that she is doing well on current medication regimen.  She is agreeable to continue medications as prescribed and requested medication refills.  1. Major depressive disorder with single episode, in partial remission (HCC)  Continue- ARIPiprazole (ABILIFY) 5 MG tablet; Take 1 tablet (5 mg total) by mouth daily. Reported on 11/21/2015  Dispense: 30 tablet; Refill: 3 Continue- lamoTRIgine (LAMICTAL) 200 MG tablet; Take 1 tablet (200 mg total) by mouth daily.  Dispense: 30 tablet; Refill: 3  2. Generalized anxiety disorder  Continue- ALPRAZolam (XANAX) 0.5 MG tablet; Take 1 tablet (0.5 mg total) by mouth  at bedtime as needed for anxiety.  Dispense: 30 tablet; Refill: 0 Continue- venlafaxine XR (EFFEXOR-XR) 150 MG 24 hr capsule; Take 1 capsule (150 mg total) by mouth daily with breakfast.  Dispense: 30 capsule; Refill: 3  Follow up in 3 months   Salley Slaughter, NP 06/18/2020, 9:12 AM

## 2020-06-26 ENCOUNTER — Ambulatory Visit
Admission: RE | Admit: 2020-06-26 | Discharge: 2020-06-26 | Disposition: A | Discharge status: Home / Self Care | Payer: 59 | Source: Ambulatory Visit | Attending: Family Medicine | Admitting: Family Medicine

## 2020-06-26 ENCOUNTER — Other Ambulatory Visit: Payer: Self-pay

## 2020-06-26 DIAGNOSIS — Z1231 Encounter for screening mammogram for malignant neoplasm of breast: Secondary | ICD-10-CM

## 2020-06-26 DIAGNOSIS — E2839 Other primary ovarian failure: Secondary | ICD-10-CM

## 2020-07-16 ENCOUNTER — Other Ambulatory Visit: Payer: Self-pay | Admitting: *Deleted

## 2020-07-16 DIAGNOSIS — F17210 Nicotine dependence, cigarettes, uncomplicated: Secondary | ICD-10-CM

## 2020-08-04 ENCOUNTER — Other Ambulatory Visit (HOSPITAL_COMMUNITY): Payer: Self-pay | Admitting: Psychiatry

## 2020-08-04 ENCOUNTER — Telehealth (HOSPITAL_COMMUNITY): Payer: Self-pay | Admitting: *Deleted

## 2020-08-04 DIAGNOSIS — F411 Generalized anxiety disorder: Secondary | ICD-10-CM

## 2020-08-04 DIAGNOSIS — F324 Major depressive disorder, single episode, in partial remission: Secondary | ICD-10-CM

## 2020-08-04 MED ORDER — ALPRAZOLAM 0.5 MG PO TABS: 0.5000 mg | ORAL_TABLET | Freq: Every evening | ORAL | 0 refills | Status: DC | PRN

## 2020-08-04 NOTE — Telephone Encounter (Signed)
VM from patient stating her medicines are not called in and "we need to talk about this" Looks like she has refills called in on three of her medicines but not on her Xanax. Will bring this need to the attention of Eulis Canner NP.

## 2020-08-08 ENCOUNTER — Telehealth: Payer: Self-pay | Admitting: Acute Care

## 2020-08-11 NOTE — Telephone Encounter (Signed)
Spoke with pt and cancelled appt for [facility] and CT for 08/11/20. PT will call back when she has a better idea of her schedule. Will close this message and refer to referral notes.

## 2020-08-13 ENCOUNTER — Encounter: Payer: 59 | Admitting: Acute Care

## 2020-08-13 ENCOUNTER — Ambulatory Visit: Payer: 59

## 2020-09-17 ENCOUNTER — Telehealth (INDEPENDENT_AMBULATORY_CARE_PROVIDER_SITE_OTHER): Payer: 59 | Admitting: Psychiatry

## 2020-09-17 ENCOUNTER — Other Ambulatory Visit: Payer: Self-pay

## 2020-09-17 ENCOUNTER — Encounter (HOSPITAL_COMMUNITY): Payer: Self-pay | Admitting: Psychiatry

## 2020-09-17 DIAGNOSIS — F411 Generalized anxiety disorder: Secondary | ICD-10-CM | POA: Diagnosis not present

## 2020-09-17 DIAGNOSIS — F324 Major depressive disorder, single episode, in partial remission: Secondary | ICD-10-CM

## 2020-09-17 MED ORDER — LAMOTRIGINE 200 MG PO TABS: 200.0000 mg | ORAL_TABLET | Freq: Every day | ORAL | 3 refills | Status: DC

## 2020-09-17 MED ORDER — VENLAFAXINE HCL ER 150 MG PO CP24: 150.0000 mg | ORAL_CAPSULE | Freq: Every day | ORAL | 3 refills | Status: DC

## 2020-09-17 MED ORDER — ALPRAZOLAM 0.5 MG PO TABS: 0.5000 mg | ORAL_TABLET | Freq: Every evening | ORAL | 0 refills | Status: DC | PRN

## 2020-09-17 NOTE — Progress Notes (Signed)
[place] MD/PA/NP OP Progress Note Virtual Visit via Telephone Note  I connected with Cassandra Lynch on 09/17/20 at  8:30 AM EST by telephone and verified that I am speaking with the correct person using two identifiers.  Location: Patient: home Provider: Clinic   I discussed the limitations, risks, security and privacy concerns of performing an evaluation and management service by telephone and the availability of in person appointments. I also discussed with the patient that there may be a patient responsible charge related to this service. The patient expressed understanding and agreed to proceed.   I provided 30 minutes of non-face-to-face time during this encounter.       09/17/2020 8:40 AM Cassandra Lynch  MRN:  [id number]  Chief Complaint: "I've been really tearful"  HPI: 64 year old female seen today for follow up psychiatric evaluation. She has a psychiatric history of anxiety and depression. She is currently prescribed Lamictal 200 mg daily, Effexor 150 mg daily and Abilify 5 mg daily.  Today she reported that for a month she has been really tearful.    Today patient is unable to log on virtually so the exam was done over the telephone.  She describes her mood as anxious and reports that at times she is occasionally depressed.  She informed Cassandra officer that her feelings are exacerbated by worrying about her parents, grandkids, and children.  She reported that her daughter will be leaving for [place] to pursue a career in acting next month and she is nervous about this new journey.  Provider conducted a GAD-7 and patient scored a 10.  Provider also conducted a PHQ-9 and patient scored a 3.  She informed provider that at this time she feels like her medications are working however noted that she would like to reassess in a month after her daughter has left.  Provider endorsed understanding.     Provider also asked patient if she would like to follow-up with her therapist to help cope however she  noted that at this time she does not.  She endorses adequate sleep and appitite.  She denies SI/HI/AVH.   No medication adjustments made today.  She is agreeable to continue medications as prescribed.  No other concerns at this time.  Visit Diagnosis:    ICD-10-CM   1. Generalized anxiety disorder  F41.1 ALPRAZolam (XANAX) 0.5 MG tablet    venlafaxine XR (EFFEXOR-XR) 150 MG 24 hr capsule  2. Major depressive disorder with single episode, in partial remission (HCC)  F32.4 lamoTRIgine (LAMICTAL) 200 MG tablet    Past Psychiatric History: Anxiety and depression  Past Medical History:  Past Medical History:  Diagnosis Date  . Abnormal Pap smear of cervix 09/17/2015   LSIL CIN1 HR HPV:+detected  . Allergy   . Anxiety   . Depression   . Fibroids 05/2000  . Personal history of colonic adenomas 08/27/2013  . Smoker     Past Surgical History:  Procedure Laterality Date  . CESAREAN SECTION     x 2  . CHOLECYSTECTOMY, LAPAROSCOPIC  1989  . COLONOSCOPY    . FINGER SURGERY Right 2007   pointer, severed tendons  . POLYPECTOMY      Family Psychiatric History: Mother depression, sister depression, brother depression   Family History:  Family History  Problem Relation Age of Onset  . Colon polyps Father   . Colon polyps Brother   . Heart disease Brother   . Heart disease Mother   . Colon cancer Neg Hx   .  Esophageal cancer Neg Hx   . Rectal cancer Neg Hx   . Stomach cancer Neg Hx     Social History:  Social History   Socioeconomic History  . Marital status: Divorced    Spouse name: Not on file  . Number of children: 2  . Years of education: Not on file  . Highest education level: Not on file  Occupational History  . Not on file  Tobacco Use  . Smoking status: Current Every Day Smoker    Packs/day: 1.00    Years: 30.00    Pack years: 30.00    Types: Cigarettes  . Smokeless tobacco: Never Used  Vaping Use  . Vaping Use: Never used  Substance and Sexual Activity  .  Alcohol use: Yes    Alcohol/week: 1.0 standard drink    Types: 1 Glasses of wine per week    Comment: occasionally  . Drug use: No  . Sexual activity: Not Currently    Partners: Male    Birth control/protection: Post-menopausal  Other Topics Concern  . Not on file  Social History Narrative  . Not on file   Social Determinants of Health   Financial Resource Strain:   . Difficulty of Paying Living Expenses: Not on file  Food Insecurity:   . Worried About Charity fundraiser in the Last Year: Not on file  . Ran Out of Food in the Last Year: Not on file  Transportation Needs:   . Lack of Transportation (Medical): Not on file  . Lack of Transportation (Non-Medical): Not on file  Physical Activity:   . Days of Exercise per Week: Not on file  . Minutes of Exercise per Session: Not on file  Stress:   . Feeling of Stress : Not on file  Social Connections:   . Frequency of Communication with Friends and Family: Not on file  . Frequency of Social Gatherings with Friends and Family: Not on file  . Attends Religious Services: Not on file  . Active Member of Clubs or Organizations: Not on file  . Attends Archivist Meetings: Not on file  . Marital Status: Not on file    Allergies:  Allergies  Allergen Reactions  . Biaxin [Clarithromycin] Hives  . Ceftin [Cefuroxime Axetil]   . Vancomycin     Metabolic Disorder Labs: Lab Results  Component Value Date   HGBA1C 5.4 02/05/2020   MPG 103 11/12/2016   No results found for: PROLACTIN Lab Results  Component Value Date   CHOL 187 02/05/2020   TRIG 145 02/05/2020   HDL 52 02/05/2020   CHOLHDL 3.6 02/05/2020   VLDL 27 11/12/2016   LDLCALC 109 (H) 02/05/2020   LDLCALC 162 (H) 11/12/2016   Lab Results  Component Value Date   TSH 3.650 02/05/2020   TSH 3.48 11/12/2016    Therapeutic Level Labs: No results found for: LITHIUM No results found for: VALPROATE No components found for:  CBMZ  Current  Medications: Current Outpatient Medications  Medication Sig Dispense Refill  . ALPRAZolam (XANAX) 0.5 MG tablet Take 1 tablet (0.5 mg total) by mouth at bedtime as needed for anxiety. 30 tablet 0  . ARIPiprazole (ABILIFY) 5 MG tablet Take 1 tablet (5 mg total) by mouth daily. Reported on 11/21/2015 30 tablet 3  . Desloratadine-Pseudoephedrine (CLARINEX-D 12 HOUR PO) Take 1 tablet by mouth 2 (two) times daily.    Marland Kitchen lamoTRIgine (LAMICTAL) 200 MG tablet Take 1 tablet (200 mg total) by mouth daily. [place]  tablet 3  . venlafaxine XR (EFFEXOR-XR) 150 MG 24 hr capsule Take 1 capsule (150 mg total) by mouth daily with breakfast. 30 capsule 3   Current Facility-Administered Medications  Medication Dose Route Frequency Provider Last Rate Last Admin  . 0.9 %  sodium chloride infusion  500 mL Intravenous Once Gatha Mayer, MD         Musculoskeletal: Strength & Muscle Tone: Unable to assess due to telephone visit [place]: Unable to assess due to telephone visit Patient leans: N/A  Psychiatric Specialty Exam: Review of Systems  Last menstrual period 12/17/2007.There is no height or weight on file to calculate BMI.  General Appearance: Unable to assess due to telephone visit  Eye Contact:  Unable to assess due to telephone visit  Speech:  Clear and Coherent and Normal Rate  Volume:  Normal  Mood:  Anxious  Affect:  Congruent and Tearful  Thought Process:  Coherent, Goal Directed and Linear  Orientation:  Full (Time, Place, and Person)  Thought Content: WDL and Logical   Suicidal Thoughts:  No  Homicidal Thoughts:  No  Memory:  Immediate;   Good Recent;   Good Remote;   Good  Judgement:  Good  Insight:  Good  Psychomotor Activity:  Normal  Concentration:  Concentration: Good and Attention Span: Good  Recall:  Good  Fund of Knowledge: Good  Language: Good  Akathisia:  No  Handed:  Right  AIMS (if indicated): Not Done  Assets:  Communication Skills Desire for Improvement Financial  Resources/Insurance Housing Social Support  ADL's:  Intact  Cognition: WNL  Sleep:  Good   Screenings: GAD-7     [facility] 09/17/2020 in [facility]  Total GAD-7 Score 10    PHQ2-9     Video Visit [facility] 09/17/2020 in [facility]  PHQ-2 Total Score 1  PHQ-9 Total Score 3       Assessment and Plan: Patient endorses symptoms of anxiety and occasional depression.  She states that her feelings are exacerbated by warning about her family.  At this time she is agreeable to continue medications as prescribed.  1. Major depressive disorder with single episode, in partial remission (HCC)  Continue- ARIPiprazole (ABILIFY) 5 MG tablet; Take 1 tablet (5 mg total) by mouth daily. Reported on 11/21/2015  Dispense: 30 tablet; Refill: 3 Continue- lamoTRIgine (LAMICTAL) 200 MG tablet; Take 1 tablet (200 mg total) by mouth daily.  Dispense: 30 tablet; Refill: 3  2. Generalized anxiety disorder  Continue- ALPRAZolam (XANAX) 0.5 MG tablet; Take 1 tablet (0.5 mg total) by mouth at bedtime as needed for anxiety.  Dispense: 30 tablet; Refill: 0 Continue- venlafaxine XR (EFFEXOR-XR) 150 MG 24 hr capsule; Take 1 capsule (150 mg total) by mouth daily with breakfast.  Dispense: 30 capsule; Refill: 3  Follow up in 1 months   Salley Slaughter, NP 09/17/2020, 8:40 AM

## 2020-10-21 ENCOUNTER — Other Ambulatory Visit: Payer: Self-pay

## 2020-10-21 ENCOUNTER — Encounter (HOSPITAL_COMMUNITY): Payer: Self-pay | Admitting: Psychiatry

## 2020-10-21 ENCOUNTER — Telehealth (INDEPENDENT_AMBULATORY_CARE_PROVIDER_SITE_OTHER): Payer: 59 | Admitting: Psychiatry

## 2020-10-21 DIAGNOSIS — F324 Major depressive disorder, single episode, in partial remission: Secondary | ICD-10-CM

## 2020-10-21 DIAGNOSIS — F411 Generalized anxiety disorder: Secondary | ICD-10-CM

## 2020-10-21 MED ORDER — LAMOTRIGINE 200 MG PO TABS: 200.0000 mg | ORAL_TABLET | Freq: Every day | ORAL | 3 refills | Status: DC

## 2020-10-21 MED ORDER — VENLAFAXINE HCL ER 150 MG PO CP24: 150.0000 mg | ORAL_CAPSULE | Freq: Every day | ORAL | 3 refills | Status: DC

## 2020-10-21 MED ORDER — ALPRAZOLAM 0.5 MG PO TABS: 0.5000 mg | ORAL_TABLET | Freq: Every evening | ORAL | 0 refills | Status: DC | PRN

## 2020-10-21 MED ORDER — ARIPIPRAZOLE 5 MG PO TABS: 5.0000 mg | ORAL_TABLET | Freq: Every day | ORAL | 3 refills | Status: DC

## 2020-10-21 NOTE — Progress Notes (Signed)
[place] MD/PA/NP OP Progress Note Virtual Visit via Telephone Note  I connected with Cassandra Lynch on 10/21/20 at 10:30 AM EST by telephone and verified that I am speaking with the correct person using two identifiers.  Location: Patient: home Provider: Clinic   I discussed the limitations, risks, security and privacy concerns of performing an evaluation and management service by telephone and the availability of in person appointments. I also discussed with the patient that there may be a patient responsible charge related to this service. The patient expressed understanding and agreed to proceed.   I provided 30 minutes of non-face-to-face time during this encounter.       10/21/2020 10:52 AM [name]  MRN:  [id number]  Chief Complaint: "I'm feeling a whole lot better"  HPI: 65 year old female seen today for follow up psychiatric evaluation. She has a psychiatric history of anxiety and depression. She is currently prescribed Lamictal 200 mg daily, Effexor 150 mg daily and Abilify 5 mg daily.  Today she reported that her medications are effective in managing her psychiatric condition.  Today patient is unable to log on virtually so the exam was done over the telephone.  She informed provider that since her last visit she is feeling better.  She notes that the holidays were hectic and notes that she was overwhelmed during that time.  Provider conducted a GAD-7 and patient scored 9, at last visit she scored a 10.  Provider also conducted a PHQ 9 and patient scored a 2, at last visit she scored a 3.  She notes her daughter will be leaving for [place] on January 16 and notes that she is coping with this well.  Today she denies SI/HI/VAH or paranoia.  She endorsed adequate sleep and appetite.    No medication adjustments made today.  She is agreeable to continue medications as prescribed.  No other concerns at this time.  Visit Diagnosis:    ICD-10-CM   1. Major depressive disorder with  single episode, in partial remission (HCC)  F32.4 ARIPiprazole (ABILIFY) 5 MG tablet    lamoTRIgine (LAMICTAL) 200 MG tablet  2. Generalized anxiety disorder  F41.1 venlafaxine XR (EFFEXOR-XR) 150 MG 24 hr capsule    ALPRAZolam (XANAX) 0.5 MG tablet    Past Psychiatric History: Anxiety and depression  Past Medical History:  Past Medical History:  Diagnosis Date  . Abnormal Pap smear of cervix 09/17/2015   LSIL CIN1 HR HPV:+detected  . Allergy   . Anxiety   . Depression   . Fibroids 05/2000  . Personal history of colonic adenomas 08/27/2013  . Smoker     Past Surgical History:  Procedure Laterality Date  . CESAREAN SECTION     x 2  . CHOLECYSTECTOMY, LAPAROSCOPIC  1989  . COLONOSCOPY    . FINGER SURGERY Right 2007   pointer, severed tendons  . POLYPECTOMY      Family Psychiatric History: Mother depression, sister depression, brother depression   Family History:  Family History  Problem Relation Age of Onset  . Colon polyps Father   . Colon polyps Brother   . Heart disease Brother   . Heart disease Mother   . Colon cancer Neg Hx   . Esophageal cancer Neg Hx   . Rectal cancer Neg Hx   . Stomach cancer Neg Hx     Social History:  Social History   Socioeconomic History  . Marital status: Divorced    Spouse name: Not on file  .  Number of children: 2  . Years of education: Not on file  . Highest education level: Not on file  Occupational History  . Not on file  Tobacco Use  . Smoking status: Current Every Day Smoker    Packs/day: 1.00    Years: 30.00    Pack years: 30.00    Types: Cigarettes  . Smokeless tobacco: Never Used  Vaping Use  . Vaping Use: Never used  Substance and Sexual Activity  . Alcohol use: Yes    Alcohol/week: 1.0 standard drink    Types: 1 Glasses of wine per week    Comment: occasionally  . Drug use: No  . Sexual activity: Not Currently    Partners: Male    Birth control/protection: Post-menopausal  Other Topics Concern  . Not on  file  Social History Narrative  . Not on file   Social Determinants of Health   Financial Resource Strain: Not on file  Food Insecurity: Not on file  Transportation Needs: Not on file  Physical Activity: Not on file  Stress: Not on file  Social Connections: Not on file    Allergies:  Allergies  Allergen Reactions  . Biaxin [Clarithromycin] Hives  . Ceftin [Cefuroxime Axetil]   . Vancomycin     Metabolic Disorder Labs: Lab Results  Component Value Date   HGBA1C 5.4 02/05/2020   MPG 103 11/12/2016   No results found for: PROLACTIN Lab Results  Component Value Date   CHOL 187 02/05/2020   TRIG 145 02/05/2020   HDL 52 02/05/2020   CHOLHDL 3.6 02/05/2020   VLDL 27 11/12/2016   LDLCALC 109 (H) 02/05/2020   LDLCALC 162 (H) 11/12/2016   Lab Results  Component Value Date   TSH 3.650 02/05/2020   TSH 3.48 11/12/2016    Therapeutic Level Labs: No results found for: LITHIUM No results found for: VALPROATE No components found for:  CBMZ  Current Medications: Current Outpatient Medications  Medication Sig Dispense Refill  . ALPRAZolam (XANAX) 0.5 MG tablet Take 1 tablet (0.5 mg total) by mouth at bedtime as needed for anxiety. 30 tablet 0  . ARIPiprazole (ABILIFY) 5 MG tablet Take 1 tablet (5 mg total) by mouth daily. Reported on 11/21/2015 30 tablet 3  . Desloratadine-Pseudoephedrine (CLARINEX-D 12 HOUR PO) Take 1 tablet by mouth 2 (two) times daily.    Marland Kitchen lamoTRIgine (LAMICTAL) 200 MG tablet Take 1 tablet (200 mg total) by mouth daily. 30 tablet 3  . venlafaxine XR (EFFEXOR-XR) 150 MG 24 hr capsule Take 1 capsule (150 mg total) by mouth daily with breakfast. 30 capsule 3   Current Facility-Administered Medications  Medication Dose Route Frequency Provider Last Rate Last Admin  . 0.9 %  sodium chloride infusion  500 mL Intravenous Once Gatha Mayer, MD         Musculoskeletal: Strength & Muscle Tone: Unable to assess due to telephone visit [place]: Unable  to assess due to telephone visit Patient leans: N/A  Psychiatric Specialty Exam: Review of Systems  Last menstrual period 12/17/2007.There is no height or weight on file to calculate BMI.  General Appearance: Unable to assess due to telephone visit  Eye Contact:  Unable to assess due to telephone visit  Speech:  Clear and Coherent and Normal Rate  Volume:  Normal  Mood:  Euthymic  Affect:  Congruent  Thought Process:  Coherent, Goal Directed and Linear  Orientation:  Full (Time, Place, and Person)  Thought Content: WDL and Logical  Suicidal Thoughts:  No  Homicidal Thoughts:  No  Memory:  Immediate;   Good Recent;   Good Remote;   Good  Judgement:  Good  Insight:  Good  Psychomotor Activity:  Normal  Concentration:  Concentration: Good and Attention Span: Good  Recall:  Good  Fund of Knowledge: Good  Language: Good  Akathisia:  No  Handed:  Right  AIMS (if indicated): Not Done  Assets:  Communication Skills Desire for Improvement Financial Resources/Insurance Housing Social Support  ADL's:  Intact  Cognition: WNL  Sleep:  Good   Screenings: GAD-7   Flowsheet Row [facility]21/2020 [facility] 09/17/2020 [facility]  Total GAD-7 Score 9 10    PHQ2-9   Flowsheet Row Video Visit from 10/21/2020 in [facility] Video Visit from 09/17/2020 in [facility]  PHQ-2 Total Score 1 1  PHQ-9 Total Score 2 3       Assessment and Plan: Patient notes that she is doing well on her current medication regimen.  No medication changes made today.  Patient agreeable to continue medication as prescribed 1. Major depressive disorder with single episode, in partial remission (HCC)  Continue- ARIPiprazole (ABILIFY) 5 MG tablet; Take 1 tablet (5 mg total) by mouth daily. Reported on 11/21/2015  Dispense: 30 tablet; Refill: 3 Continue- lamoTRIgine  (LAMICTAL) 200 MG tablet; Take 1 tablet (200 mg total) by mouth daily.  Dispense: 30 tablet; Refill: 3  2. Generalized anxiety disorder  Continue- ALPRAZolam (XANAX) 0.5 MG tablet; Take 1 tablet (0.5 mg total) by mouth at bedtime as needed for anxiety.  Dispense: 30 tablet; Refill: 0 Continue- venlafaxine XR (EFFEXOR-XR) 150 MG 24 hr capsule; Take 1 capsule (150 mg total) by mouth daily with breakfast.  Dispense: 30 capsule; Refill: 3  Follow up in 3 months   Shanna Cisco, NP 10/21/2020, 10:52 AM

## 2020-11-25 ENCOUNTER — Other Ambulatory Visit (HOSPITAL_COMMUNITY): Payer: Self-pay | Admitting: Psychiatry

## 2020-11-25 DIAGNOSIS — F411 Generalized anxiety disorder: Secondary | ICD-10-CM

## 2020-12-23 ENCOUNTER — Other Ambulatory Visit (HOSPITAL_COMMUNITY): Payer: Self-pay | Admitting: Psychiatry

## 2020-12-23 DIAGNOSIS — F411 Generalized anxiety disorder: Secondary | ICD-10-CM

## 2021-01-20 ENCOUNTER — Telehealth (INDEPENDENT_AMBULATORY_CARE_PROVIDER_SITE_OTHER): Payer: 59 | Admitting: Psychiatry

## 2021-01-20 ENCOUNTER — Other Ambulatory Visit: Payer: Self-pay

## 2021-01-20 ENCOUNTER — Encounter (HOSPITAL_COMMUNITY): Payer: Self-pay | Admitting: Psychiatry

## 2021-01-20 DIAGNOSIS — F324 Major depressive disorder, single episode, in partial remission: Secondary | ICD-10-CM | POA: Diagnosis not present

## 2021-01-20 DIAGNOSIS — F4321 Adjustment disorder with depressed mood: Secondary | ICD-10-CM

## 2021-01-20 DIAGNOSIS — F411 Generalized anxiety disorder: Secondary | ICD-10-CM | POA: Diagnosis not present

## 2021-01-20 MED ORDER — VENLAFAXINE HCL ER 150 MG PO CP24: 150.0000 mg | ORAL_CAPSULE | Freq: Every day | ORAL | 3 refills | Status: DC

## 2021-01-20 MED ORDER — ARIPIPRAZOLE 5 MG PO TABS: 5.0000 mg | ORAL_TABLET | Freq: Every day | ORAL | 3 refills | Status: DC

## 2021-01-20 MED ORDER — LAMOTRIGINE 200 MG PO TABS: 200.0000 mg | ORAL_TABLET | Freq: Every day | ORAL | 3 refills | Status: DC

## 2021-01-20 MED ORDER — ALPRAZOLAM 0.5 MG PO TABS: ORAL_TABLET | ORAL | 2 refills | Status: DC

## 2021-01-20 NOTE — Progress Notes (Signed)
[place] MD/PA/NP OP Progress Note Virtual Visit via Video Note  I connected with Cassandra Lynch on 01/20/21 at 10:30 AM EDT by a video enabled telemedicine application and verified that I am speaking with the correct person using two identifiers.  Location: Patient: Home Provider: Clinic   I discussed the limitations of evaluation and management by telemedicine and the availability of in person appointments. The patient expressed understanding and agreed to proceed.  I provided 30 minutes of non-face-to-face time during this encounter.          01/20/2021 10:43 AM [name]  MRN:  [id number]  Chief Complaint: "My cat died three weeks ago and i'm crying all the time"  HPI: 65 year old female seen today for follow up psychiatric evaluation. She has a psychiatric history of anxiety and depression. She is currently prescribed Lamictal 200 mg daily, Effexor 150 mg daily and Abilify 5 mg daily.  Today she reported that her medications are somehat effective in managing her psychiatric conditions.  Today she is tearful, pleasant, cooperative, and engaged in conversation. She informed Probation officer that three weeks ago her cat that she has had for 16 years died. She notes that since her cats death she has been increasingly sad and tearful because she has not had to deal with a lot of death in her lifetime. She informed Probation officer that she has also been more anxious. She notes she now is more concerned about her children, grandchildren, and mother. Provider conducted a GAD 7 and patient scored a 14, at her last visit she scored a 9. Provider also conducted a PHQ 9 and patient scored a 7, at her last visit she scored a 2.  Today she denies SI/HI/VAH, mania, or paranoia.  She endorsed adequate sleep and appetite.    No medication adjustments made today. Provider recommended patient follow back up with her therapist to help cope with her loss. She was agreeable to this recommendation. Provider informed patient  that she would be re-evaluated in 3 moths and if medications needs to be readjusted they will be. She is agreeable to continue medications as prescribed.  No other concerns at this time.  Visit Diagnosis:    ICD-10-CM   1. Generalized anxiety disorder  F41.1 venlafaxine XR (EFFEXOR-XR) 150 MG 24 hr capsule    ALPRAZolam (XANAX) 0.5 MG tablet  2. Major depressive disorder with single episode, in partial remission (HCC)  F32.4 lamoTRIgine (LAMICTAL) 200 MG tablet    ARIPiprazole (ABILIFY) 5 MG tablet  3. Grief  F43.21     Past Psychiatric History: Anxiety and depression  Past Medical History:  Past Medical History:  Diagnosis Date  . Abnormal Pap smear of cervix 09/17/2015   LSIL CIN1 HR HPV:+detected  . Allergy   . Anxiety   . Depression   . Fibroids 05/2000  . Personal history of colonic adenomas 08/27/2013  . Smoker     Past Surgical History:  Procedure Laterality Date  . CESAREAN SECTION     x 2  . CHOLECYSTECTOMY, LAPAROSCOPIC  1989  . COLONOSCOPY    . FINGER SURGERY Right 2007   pointer, severed tendons  . POLYPECTOMY      Family Psychiatric History: Mother depression, sister depression, brother depression   Family History:  Family History  Problem Relation Age of Onset  . Colon polyps Father   . Colon polyps Brother   . Heart disease Brother   . Heart disease Mother   . Colon cancer Neg Hx   .  Esophageal cancer Neg Hx   . Rectal cancer Neg Hx   . Stomach cancer Neg Hx     Social History:  Social History   Socioeconomic History  . Marital status: Divorced    Spouse name: Not on file  . Number of children: 2  . Years of education: Not on file  . Highest education level: Not on file  Occupational History  . Not on file  Tobacco Use  . Smoking status: Current Every Day Smoker    Packs/day: 1.00    Years: 30.00    Pack years: 30.00    Types: Cigarettes  . Smokeless tobacco: Never Used  Vaping Use  . Vaping Use: Never used  Substance and Sexual  Activity  . Alcohol use: Yes    Alcohol/week: 1.0 standard drink    Types: 1 Glasses of wine per week    Comment: occasionally  . Drug use: No  . Sexual activity: Not Currently    Partners: Male    Birth control/protection: Post-menopausal  Other Topics Concern  . Not on file  Social History Narrative  . Not on file   Social Determinants of Health   Financial Resource Strain: Not on file  Food Insecurity: Not on file  Transportation Needs: Not on file  Physical Activity: Not on file  Stress: Not on file  Social Connections: Not on file    Allergies:  Allergies  Allergen Reactions  . Biaxin [Clarithromycin] Hives  . Ceftin [Cefuroxime Axetil]   . Vancomycin     Metabolic Disorder Labs: Lab Results  Component Value Date   HGBA1C 5.4 02/05/2020   MPG 103 11/12/2016   No results found for: PROLACTIN Lab Results  Component Value Date   CHOL 187 02/05/2020   TRIG 145 02/05/2020   HDL 52 02/05/2020   CHOLHDL 3.6 02/05/2020   VLDL 27 11/12/2016   LDLCALC 109 (H) 02/05/2020   LDLCALC 162 (H) 11/12/2016   Lab Results  Component Value Date   TSH 3.650 02/05/2020   TSH 3.48 11/12/2016    Therapeutic Level Labs: No results found for: LITHIUM No results found for: VALPROATE No components found for:  CBMZ  Current Medications: Current Outpatient Medications  Medication Sig Dispense Refill  . ALPRAZolam (XANAX) 0.5 MG tablet TAKE 1 TABLET BY MOUTH EVERY NIGHT AT BEDTIME AS NEEDED FOR ANXIETY 30 tablet 2  . ARIPiprazole (ABILIFY) 5 MG tablet Take 1 tablet (5 mg total) by mouth daily. Reported on 11/21/2015 30 tablet 3  . Desloratadine-Pseudoephedrine (CLARINEX-D 12 HOUR PO) Take 1 tablet by mouth 2 (two) times daily.    Marland Kitchen lamoTRIgine (LAMICTAL) 200 MG tablet Take 1 tablet (200 mg total) by mouth daily. 30 tablet 3  . venlafaxine XR (EFFEXOR-XR) 150 MG 24 hr capsule Take 1 capsule (150 mg total) by mouth daily with breakfast. 30 capsule 3   Current  Facility-Administered Medications  Medication Dose Route Frequency Provider Last Rate Last Admin  . 0.9 %  sodium chloride infusion  500 mL Intravenous Once Gatha Mayer, MD         Musculoskeletal: Strength & Muscle Tone: Unable to assess due to telehealth visit [place]: Unable to assess due to telehealth visit Patient leans: N/A  Psychiatric Specialty Exam: Review of Systems  Last menstrual period 12/17/2007.There is no height or weight on file to calculate BMI.  General Appearance: Well Groomed  Eye Contact:  Good  Speech:  Clear and Coherent and Normal Rate  Volume:  Normal  Mood:  Anxious and Depressed  Affect:  Congruent and Tearful  Thought Process:  Coherent, Goal Directed and Linear  Orientation:  Full (Time, Place, and Person)  Thought Content: WDL and Logical   Suicidal Thoughts:  No  Homicidal Thoughts:  No  Memory:  Immediate;   Good Recent;   Good Remote;   Good  Judgement:  Good  Insight:  Good  Psychomotor Activity:  Normal  Concentration:  Concentration: Good and Attention Span: Good  Recall:  Good  Fund of Knowledge: Good  Language: Good  Akathisia:  No  Handed:  Right  AIMS (if indicated): Not Done  Assets:  Communication Skills Desire for Improvement Financial Resources/Insurance Housing Social Support  ADL's:  Intact  Cognition: WNL  Sleep:  Good   Screenings: GAD-7   Flowsheet Row Video Visit from 01/20/2021 in [facility] Video Visit from 10/21/2020 in [facility] Video Visit from 09/17/2020 in [facility]  Total GAD-7 Score 14 9 10     PHQ2-9   Flowsheet Row Video Visit from 01/20/2021 in [facility] Video Visit from 10/21/2020 in [facility] Video Visit from 09/17/2020 in [facility]  PHQ-2 Total Score 3 1 1   PHQ-9 Total Score 7 2 3        Assessment and Plan:  Patient endorses symptoms of anxiety, depression, and grief. No medication adjustments made today. Provider recommended patient follow back up with her therapist to help cope with her loss. She was agreeable to this recommendation. Provider informed patient that she would be re-evaluated in 3 moths and if medications needs to be readjusted they will be. She is agreeable to continue medications as prescribed.  1. Major depressive disorder with single episode, in partial remission (HCC)  Continue- ARIPiprazole (ABILIFY) 5 MG tablet; Take 1 tablet (5 mg total) by mouth daily. Reported on 11/21/2015  Dispense: 30 tablet; Refill: 3 Continue- lamoTRIgine (LAMICTAL) 200 MG tablet; Take 1 tablet (200 mg total) by mouth daily.  Dispense: 30 tablet; Refill: 3  2. Generalized anxiety disorder  Continue- ALPRAZolam (XANAX) 0.5 MG tablet; Take 1 tablet (0.5 mg total) by mouth at bedtime as needed for anxiety.  Dispense: 30 tablet; Refill: 0 Continue- venlafaxine XR (EFFEXOR-XR) 150 MG 24 hr capsule; Take 1 capsule (150 mg total) by mouth daily with breakfast.  Dispense: 30 capsule; Refill: 3  Follow up in 3 months  Follow up with therapy  Salley Slaughter, NP 01/20/2021, 10:43 AM

## 2021-03-05 ENCOUNTER — Other Ambulatory Visit: Payer: Self-pay

## 2021-03-05 ENCOUNTER — Telehealth (HOSPITAL_COMMUNITY): Payer: Self-pay | Admitting: Licensed Clinical Social Worker

## 2021-03-05 ENCOUNTER — Ambulatory Visit (HOSPITAL_COMMUNITY): Payer: 59 | Admitting: Licensed Clinical Social Worker

## 2021-03-05 NOTE — Telephone Encounter (Signed)
LCSW sent text link for video session per schedule. Pt signs on for session and states she has a need to reschedule. She reports "I've been trying to get someone with a truck over here all week and they just called to say they could come". Pt said she expected them in ~ 15 min. LCSW advised one of the admin staff would be in touch to tell her when next avail appt would be. Pt apologizes and states appreciation.

## 2021-04-16 ENCOUNTER — Other Ambulatory Visit: Payer: Self-pay

## 2021-04-16 ENCOUNTER — Telehealth (HOSPITAL_COMMUNITY): Payer: Self-pay | Admitting: Psychiatry

## 2021-04-23 ENCOUNTER — Ambulatory Visit: Payer: 59

## 2021-06-15 ENCOUNTER — Other Ambulatory Visit (HOSPITAL_COMMUNITY): Payer: Self-pay | Admitting: Psychiatry

## 2021-06-15 DIAGNOSIS — F411 Generalized anxiety disorder: Secondary | ICD-10-CM

## 2021-06-18 ENCOUNTER — Other Ambulatory Visit: Payer: Self-pay

## 2021-06-18 ENCOUNTER — Telehealth (INDEPENDENT_AMBULATORY_CARE_PROVIDER_SITE_OTHER): Payer: 59 | Admitting: Psychiatry

## 2021-06-18 ENCOUNTER — Encounter (HOSPITAL_COMMUNITY): Payer: Self-pay | Admitting: Psychiatry

## 2021-06-18 DIAGNOSIS — F411 Generalized anxiety disorder: Secondary | ICD-10-CM | POA: Diagnosis not present

## 2021-06-18 DIAGNOSIS — F324 Major depressive disorder, single episode, in partial remission: Secondary | ICD-10-CM

## 2021-06-18 MED ORDER — ARIPIPRAZOLE 5 MG PO TABS: 5.0000 mg | ORAL_TABLET | Freq: Every day | ORAL | 3 refills | Status: DC

## 2021-06-18 MED ORDER — VENLAFAXINE HCL ER 150 MG PO CP24: 150.0000 mg | ORAL_CAPSULE | Freq: Every day | ORAL | 3 refills | Status: DC

## 2021-06-18 MED ORDER — LAMOTRIGINE 200 MG PO TABS: 200.0000 mg | ORAL_TABLET | Freq: Every day | ORAL | 3 refills | Status: DC

## 2021-06-18 NOTE — Progress Notes (Signed)
BH MD/PA/NP OP Progress Note Virtual Visit via Video Note  I connected with Cassandra Lynch on 06/18/21 at  3:00 PM EDT by a video enabled telemedicine application and verified that I am speaking with the correct person using two identifiers.  Location: Patient: Home Provider: Clinic   I discussed the limitations of evaluation and management by telemedicine and the availability of in person appointments. The patient expressed understanding and agreed to proceed.  I provided 30 minutes of non-face-to-face time during this encounter.          06/18/2021 3:08 PM Cassandra Lynch  MRN:  [id number]  Chief Complaint: "My main concern is worrying"  HPI: 65 year old female seen today for follow up psychiatric evaluation. She has a psychiatric history of anxiety and depression. She is currently prescribed Lamictal 200 mg daily, Effexor 150 mg daily, Xanax 0.5 mg daily,  and Abilify 5 mg daily.  Today she reported that her medications are effective in managing her psychiatric conditions.  Today she is pleasant, cooperative, engaged in conversation, and maintained eye contact. She informed Probation officer that her main concern is worrying.  She notes that she is concerned about her son who is flying today, her grandchildren who will be starting school, and her grandchild who is visiting friends home.  She also notes that she continues to grieve the loss of her cat.  She informed Probation officer that she was unable to go to therapy however thinks that it would be beneficial.  Patient notes that since her last visit her anxiety and depression has improved.  Provider conducted a GAD-7 and patient scored a 9, at her last visit she scored a 14.  Provider also conducted a PHQ-9 he scored a 5, at her last visit she scored a 7.  She endorses adequate sleep and appetite.  Today she denies SI/HI/VAH, mania, and paranoia.    Patient notes that she only takes Xanax as needed.  Provider informed patient that Xanax will be reduced at  her next visit.  She endorsed understanding and was agreeable.  No medication changes made today.  Xanax not filled at this time because provider recently filled it on 06/16/2021. Patient agreeable to taking medications as prescribed.  Patient rescheduled for therapy appointment on June 26, 2021.  No other concerns at this time.   Visit Diagnosis:    ICD-10-CM   1. Major depressive disorder with single episode, in partial remission (HCC)  F32.4 ARIPiprazole (ABILIFY) 5 MG tablet    lamoTRIgine (LAMICTAL) 200 MG tablet    2. Generalized anxiety disorder  F41.1 venlafaxine XR (EFFEXOR-XR) 150 MG 24 hr capsule      Past Psychiatric History: Anxiety and depression  Past Medical History:  Past Medical History:  Diagnosis Date   Abnormal Pap smear of cervix 09/17/2015   LSIL CIN1 HR HPV:+detected   Allergy    Anxiety    Depression    Fibroids 05/2000   Personal history of colonic adenomas 08/27/2013   Smoker     Past Surgical History:  Procedure Laterality Date   CESAREAN SECTION     x 2   CHOLECYSTECTOMY, LAPAROSCOPIC  1989   COLONOSCOPY     FINGER SURGERY Right 2007   pointer, severed tendons   POLYPECTOMY      Family Psychiatric History: Mother depression, sister depression, brother depression   Family History:  Family History  Problem Relation Age of Onset   Colon polyps Father    Colon polyps Brother    Heart  disease Brother    Heart disease Mother    Colon cancer Neg Hx    Esophageal cancer Neg Hx    Rectal cancer Neg Hx    Stomach cancer Neg Hx     Social History:  Social History   Socioeconomic History   Marital status: Divorced    Spouse name: Not on file   Number of children: 2   Years of education: Not on file   Highest education level: Not on file  Occupational History   Not on file  Tobacco Use   Smoking status: Every Day    Packs/day: 1.00    Years: 30.00    Pack years: 30.00    Types: Cigarettes   Smokeless tobacco: Never  Vaping Use    Vaping Use: Never used  Substance and Sexual Activity   Alcohol use: Yes    Alcohol/week: 1.0 standard drink    Types: 1 Glasses of wine per week    Comment: occasionally   Drug use: No   Sexual activity: Not Currently    Partners: Male    Birth control/protection: Post-menopausal  Other Topics Concern   Not on file  Social History Narrative   Not on file   Social Determinants of Health   Financial Resource Strain: Not on file  Food Insecurity: Not on file  Transportation Needs: Not on file  Physical Activity: Not on file  Stress: Not on file  Social Connections: Not on file    Allergies:  Allergies  Allergen Reactions   Biaxin [Clarithromycin] Hives   Ceftin [Cefuroxime Axetil]    Vancomycin     Metabolic Disorder Labs: Lab Results  Component Value Date   HGBA1C 5.4 02/05/2020   MPG 103 11/12/2016   No results found for: PROLACTIN Lab Results  Component Value Date   CHOL 187 02/05/2020   TRIG 145 02/05/2020   HDL 52 02/05/2020   CHOLHDL 3.6 02/05/2020   VLDL 27 11/12/2016   LDLCALC 109 (H) 02/05/2020   LDLCALC 162 (H) 11/12/2016   Lab Results  Component Value Date   TSH 3.650 02/05/2020   TSH 3.48 11/12/2016    Therapeutic Level Labs: No results found for: LITHIUM No results found for: VALPROATE No components found for:  CBMZ  Current Medications: Current Outpatient Medications  Medication Sig Dispense Refill   ALPRAZolam (XANAX) 0.5 MG tablet TAKE 1 TABLET BY MOUTH EVERY NIGHT AT BEDTIME AS NEEDED FOR ANXIETY 30 tablet 0   ARIPiprazole (ABILIFY) 5 MG tablet Take 1 tablet (5 mg total) by mouth daily. Reported on 11/21/2015 30 tablet 3   Desloratadine-Pseudoephedrine (CLARINEX-D 12 HOUR PO) Take 1 tablet by mouth 2 (two) times daily.     lamoTRIgine (LAMICTAL) 200 MG tablet Take 1 tablet (200 mg total) by mouth daily. 30 tablet 3   venlafaxine XR (EFFEXOR-XR) 150 MG 24 hr capsule Take 1 capsule (150 mg total) by mouth daily with breakfast. 30 capsule  3   Current Facility-Administered Medications  Medication Dose Route Frequency Provider Last Rate Last Admin   0.9 %  sodium chloride infusion  500 mL Intravenous Once Gatha Mayer, MD         Musculoskeletal: Strength & Muscle Tone:  Unable to assess due to telehealth visit [place]:  Unable to assess due to telehealth visit Patient leans: N/A  Psychiatric Specialty Exam: Review of Systems  Last menstrual period 12/17/2007.There is no height or weight on file to calculate BMI.  General Appearance: Well Groomed  Eye Contact:  Good  Speech:  Clear and Coherent and Normal Rate  Volume:  Normal  Mood:  Anxious  Affect:  Appropriate and Congruent  Thought Process:  Coherent, Goal Directed and Linear  Orientation:  Full (Time, Place, and Person)  Thought Content: WDL and Logical   Suicidal Thoughts:  No  Homicidal Thoughts:  No  Memory:  Immediate;   Good Recent;   Good Remote;   Good  Judgement:  Good  Insight:  Good  Psychomotor Activity:  Normal  Concentration:  Concentration: Good and Attention Span: Good  Recall:  Good  Fund of Knowledge: Good  Language: Good  Akathisia:  No  Handed:  Right  AIMS (if indicated): Not Done  Assets:  Communication Skills Desire for Improvement Financial Resources/Insurance Housing Social Support  ADL's:  Intact  Cognition: WNL  Sleep:  Good   Screenings: GAD-7    Flowsheet Row Video Visit from 06/18/2021 in [facility] Video Visit from 01/20/2021 in [facility] Video Visit from 10/21/2020 in [facility] Video Visit from 09/17/2020 in [facility]  Total GAD-7 Score '9 14 9 10      '$ PHQ2-9    Flowsheet Row Video Visit from 06/18/2021 in [facility] Video Visit from 01/20/2021 in [facility] Video Visit from 10/21/2020 in [facility]  Video Visit from 09/17/2020 in [place]  PHQ-2 Total Score '1 3 1 1  '$ PHQ-9 Total Score '5 7 2 3        '$ Assessment and Plan: Patient notes that her anxiety and depression has improved since her last visit however notes that she worries about her family and grieves the loss of her cat. Patient notes that she only takes Xanax as needed.  Provider informed patient that Xanax will be reduced at her next visit.  She endorsed understanding and was agreeable.  Xanax not filled at this time because provider recently filled it on 06/16/2021.  No medication changes made today.  Patient agreeable to taking medications as prescribed.  Patient rescheduled for therapy appointment on June 26, 2021.   1. Major depressive disorder with single episode, in partial remission ([place])  Continue- ARIPiprazole (ABILIFY) 5 MG tablet; Take 1 tablet (5 mg total) by mouth daily. Reported on 11/21/2015  Dispense: 30 tablet; Refill: 3 Continue- lamoTRIgine (LAMICTAL) 200 MG tablet; Take 1 tablet (200 mg total) by mouth daily.  Dispense: 30 tablet; Refill: 3  2. Generalized anxiety disorder  Continue- venlafaxine XR (EFFEXOR-XR) 150 MG 24 hr capsule; Take 1 capsule (150 mg total) by mouth daily with breakfast.  Dispense: 30 capsule; Refill: 3  Follow up in 3 months  Follow up with therapy  Salley Slaughter, NP 06/18/2021, 3:08 PM

## 2021-06-26 ENCOUNTER — Ambulatory Visit (INDEPENDENT_AMBULATORY_CARE_PROVIDER_SITE_OTHER): Payer: 59 | Admitting: Licensed Clinical Social Worker

## 2021-06-26 ENCOUNTER — Other Ambulatory Visit: Payer: Self-pay

## 2021-06-26 DIAGNOSIS — F411 Generalized anxiety disorder: Secondary | ICD-10-CM | POA: Diagnosis not present

## 2021-07-01 NOTE — Progress Notes (Signed)
Comprehensive Clinical Assessment (CCA) Note  07/01/2021 Cassandra Lynch [id number]  Virtual Visit via Video Note  I connected with Cassandra Lynch on 06/26/21 at  8:00 AM EDT by a video enabled telemedicine application and verified that I am speaking with the correct person using two identifiers.  Location: Patient: At son's home, outside Provider: Home   I discussed the limitations of evaluation and management by telemedicine and the availability of in person appointments. The patient expressed understanding and agreed to proceed. I discussed the assessment and treatment plan with the patient. The patient was provided an opportunity to ask questions and all were answered. The patient agreed with the plan and demonstrated an understanding of the instructions.   The patient was advised to call back or seek an in-person evaluation if the symptoms worsen or if the condition fails to improve as anticipated.  I provided 45 minutes of non-face-to-face time during this encounter.  Chief Complaint:  Chief Complaint  Patient presents with   Anxiety   Depression   Visit Diagnosis: GAD   CCA Biopsychosocial Intake/Chief Complaint:  hx of dep/anx  Current Symptoms/Problems: excessive worrier, easily tearful  Patient Reported Schizophrenia/Schizoaffective Diagnosis in Past: No  Strengths: Accepting of help  Preferences: counseling, med management, virtual appts, call her [place]  Abilities: Independent transportation  Type of Services Patient Feels are Needed: counseling, med management  Initial Clinical Notes/Concerns: LCSW reviewed informed consent for counseling with pt's full acknoweldgement. Pt reports she had some counseling ~ 4 yrs ago at [facility]. Pt currenlty on med regimen she feels is working for her. 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 Has been in [place] 1 yr full time. Pt states she feels frustrated with other  fam/friends not understanding how she struggles with her dtr being gone. Also struggles with constant worry about "everything".   Mental Health Symptoms Depression:   Change in energy/activity; Tearfulness   Duration of Depressive symptoms:  Greater than two weeks   Mania:   None   Anxiety:    Worrying; Tension   Psychosis:   None   Duration of Psychotic symptoms: No data recorded  Trauma:   None   Obsessions:   Recurrent & persistent thoughts/impulses/images; Cause anxiety   Compulsions:   None   Inattention:   None   Hyperactivity/Impulsivity:   None   Oppositional/Defiant Behaviors:   None   Emotional Irregularity:  No data recorded  Other Mood/Personality Symptoms:   Intermittent feelings of dep/anx    Mental Status Exam Appearance and self-care  Stature:   Average   Weight:   Overweight   Clothing:   Casual   Grooming:   Normal   Cosmetic use:   Age appropriate   Posture/gait:   Normal   Motor activity:   Not Remarkable   Sensorium  Attention:   Normal   Concentration:   Normal   Orientation:   X5   Recall/memory:   Normal   Affect and Mood  Affect:   Appropriate   Mood:   Anxious   Relating  Eye contact:   Normal   Facial expression:   Responsive   Attitude toward examiner:   Cooperative   Thought and Language  Speech flow:  Normal   Thought content:   Appropriate to Mood and Circumstances   Preoccupation:   Ruminations   Hallucinations:   None   Organization:  No data recorded  Transport planner of Knowledge:   Good   Intelligence:   Above Average   Abstraction:   Normal   Judgement:   Normal   Reality Testing:   Adequate   Insight:   Present   Decision Making:   Normal   Social Functioning  Social Maturity:   Responsible   Social Judgement:   Normal   Stress  Stressors:   Other (Comment) (Excessive worry, dtr moved to [place])   Coping Ability:   Exhausted    Skill Deficits:   None   Supports:   Family; Friends/Service system     Religion:    Leisure/Recreation: Leisure / Recreation Do You Have Hobbies?: Yes Leisure and Hobbies: Being with grandchildren  Exercise/Diet: Exercise/Diet Do You Have Any Trouble Sleeping?: No   CCA Employment/Education Employment/Work Situation: Employment / Work Situation 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 Has Patient ever Been in the [employer]?: No  Education: Education Did Teacher, adult education From [employer]?: Yes Did Physicist, medical?: Yes What Type of College Degree Do you Have?: bachelors special ed Did [place]?: No   CCA Family/Childhood History Family and Relationship History: Family history Marital status: Divorced Divorced, when?: 7 yrs ago Additional relationship information: no current relaitonship Does patient have children?: Yes How many children?: 2 How is patient's relationship with their children?: one dtr in [place], and one son local   "A++" relationship with both    (2 grands: 3 yo and 14 mon)  Childhood History:  Childhood History By whom was/is the patient raised?: Both parents Description of patient's relationship with caregiver when they were a child: "good" close to both Patient's description of current relationship with people who raised him/her: Close to both, moving to [place] area soon, both 35's Does patient have siblings?: Yes Number of Siblings: 2 Description of patient's current relationship with siblings: one bro, one sis  "good" Did patient suffer any verbal/emotional/physical/sexual abuse as a child?: No Did patient suffer from severe childhood neglect?: No Has patient ever been sexually abused/assaulted/raped as an adolescent or adult?: No Witnessed domestic violence?: No Has patient been affected by domestic violence as an adult?: No (verbal abuse)   CCA  Substance Use Alcohol/Drug Use: Alcohol / Drug Use History of alcohol / drug use?: No history of alcohol / drug abuse   DSM5 Diagnoses: Patient Active Problem List   Diagnosis Date Noted   Grief 01/20/2021   Generalized anxiety disorder 03/20/2020   Major depressive disorder with single episode, in partial remission ([place]) 03/20/2020   Smoker 02/02/2018   Personal history of colonic adenomas 08/27/2013    Patient Centered Plan: Patient is on the following Treatment Plan(s):  Anxiety  Hermine Messick, LCSW

## 2021-07-13 ENCOUNTER — Other Ambulatory Visit (HOSPITAL_COMMUNITY): Payer: Self-pay | Admitting: Psychiatry

## 2021-07-13 DIAGNOSIS — F411 Generalized anxiety disorder: Secondary | ICD-10-CM

## 2021-08-06 ENCOUNTER — Other Ambulatory Visit (HOSPITAL_COMMUNITY): Payer: Self-pay | Admitting: Psychiatry

## 2021-08-06 DIAGNOSIS — F411 Generalized anxiety disorder: Secondary | ICD-10-CM

## 2021-08-24 ENCOUNTER — Other Ambulatory Visit: Payer: Self-pay

## 2021-08-24 ENCOUNTER — Ambulatory Visit (INDEPENDENT_AMBULATORY_CARE_PROVIDER_SITE_OTHER): Payer: Medicare HMO | Admitting: Licensed Clinical Social Worker

## 2021-08-24 DIAGNOSIS — F411 Generalized anxiety disorder: Secondary | ICD-10-CM | POA: Diagnosis not present

## 2021-08-25 NOTE — Progress Notes (Signed)
THERAPIST PROGRESS NOTE   Virtual Visit via Video Note  I connected with Cassandra Lynch on 08/24/21 at  4:00 PM EST by a video enabled telemedicine application and verified that I am speaking with the correct person using two identifiers.  Location: Patient: Home Provider: [facility]   I discussed the limitations of evaluation and management by telemedicine and the availability of in person appointments. The patient expressed understanding and agreed to proceed. I discussed the assessment and treatment plan with the patient. The patient was provided an opportunity to ask questions and all were answered. The patient agreed with the plan and demonstrated an understanding of the instructions.   The patient was advised to call back or seek an in-person evaluation if the symptoms worsen or if the condition fails to improve as anticipated.  I provided 48 minutes of non-face-to-face time during this encounter.  Participation Level: Active  Behavioral Response: CasualAlertAnxious  Type of Therapy: Individual Therapy  Treatment Goals addressed: Anxiety and Coping  Interventions: Solution Focused and Supportive  Summary: Cassandra Lynch is a 65 y.o. female who presents with hx of GAD. This date pt signs on for video session. This is first session since initial session. LCSW recognize patient's 65th birthday that passed since last session.  Patient reports she went out with friends and also states her son and his wife surprised her with a flight to [place] to see her daughter.  Patient reports she will be leaving for this trip on December 3 and will be taking her 36-year-old grandson with her, which she is delighted about.  Assessment of patient's mood reveals she continues to have some signs and symptoms of anxiety with excessive worry, poor motivation.  Patient reports she is taking medication as prescribed and does feel it is helpful.  Patient is sleeping well.  She continues to watch her  grandchildren on a regular basis during the week.  LCSW assessed for sources of anxiety and worry.  Patient reports 3 specific worries today.  The first concern is a report her best friend's daughter, who is 31 years old, had an overdose of heroin and fentanyl which resulted in her death.  Patient reports she has been as supportive as possible and knew the child well herself. Pt states "We failed her".  LCSW provided support and helped her process this thought with reframing.  LCSW provided referral to hospice grief counseling center which patient will share with her friend.  The second worry is of a financial nature.  Patient reports she accidentally paid [employer] $1000 rather than $100.  She reports she has made multiple phone calls being tossed around with no action from either [employer] nor the bank.  Patient reports she has been angry and nervous about the matter and recently contacted news stations for support and resolution.  Patient reports her third concern is related to her grandson who is 22 years old.  Patient reports her son and his wife are friends with 2 other couples who also have 46-year-old sons.  She states one of the friends sons is physically aggressive toward her grandson and encourages the other 86-year-old to gang up on her grandson when all together.  Patient advises her son will not discuss this with her, minimizes it and says his son will have to learn to take up for himself as the couples are good friends.  Assisted patient to process thoughts and feelings, problem solve.  She will approach her daughter-in-law one-on-one  as she has never discussed this concern with her daughter-in-law to see if she can make headway there.  LCSW assessed for status of patient's parents moving to the area.  She advises both of her parents will be moving to the area this coming Saturday and staying in a house near [facility] which will be very close to her.  Patient reports they are able to function independently  at this time and she is looking forward to having them closer. Pt admits to some anx over Thanksgiving since there are disagreements on which day to have Thanksgiving. She reports no issues coping with Christmas since always done on the day.  Patient reports she has a habit of "swallowing" her feelings and concerns and then eventually exploding when her worry builds up enough.  LCSW addressed coping strategies. LCSW reviewed poc including scheduling prior to close of session. Pt states appreciation for care.    Suicidal/Homicidal: Nowithout intent/plan  Therapist Response: Pt receptive to care.  Plan: Return again in ~3 weeks.  Diagnosis: Axis I: Generalized Anxiety Disorder   Hermine Messick, LCSW 08/25/2021

## 2021-08-31 ENCOUNTER — Telehealth (HOSPITAL_COMMUNITY): Payer: Self-pay | Admitting: *Deleted

## 2021-08-31 ENCOUNTER — Other Ambulatory Visit (HOSPITAL_COMMUNITY): Payer: Self-pay | Admitting: Psychiatry

## 2021-08-31 DIAGNOSIS — F411 Generalized anxiety disorder: Secondary | ICD-10-CM

## 2021-08-31 MED ORDER — VENLAFAXINE HCL ER 75 MG PO CP24: 225.0000 mg | ORAL_CAPSULE | Freq: Every day | ORAL | 3 refills | Status: DC

## 2021-08-31 NOTE — Telephone Encounter (Signed)
Patient informed Probation officer that recently she has been feeling overwhelmed.  She notes that she is tearful most days, has increased depression/anxiety.  Provider asked patient if there was any new life stressors.  She notes that recently her parents were moved away from her which she reports is a little stressful.  She denies other life changes.  Today she is agreeable to increasing Effexor 150-225 to help manage anxiety and depression.  She will follow-up with provider in 1 month for further evaluation.  No other concerns at this time.

## 2021-08-31 NOTE — Telephone Encounter (Signed)
Patient called LVM stating that she need to speak with provider about changing her med's prior to her next appointment which isn't until 12/8//22. Called patient back to ask if she would like a call back that med changes aren't changed without a update evaluation to determine clearly reason for change to medications

## 2021-09-09 ENCOUNTER — Telehealth (HOSPITAL_COMMUNITY): Payer: Self-pay | Admitting: *Deleted

## 2021-09-09 NOTE — Telephone Encounter (Signed)
Left a VM on writers phone, message she sounded tearful. She stated in her message she had a recent trauma and wanted to speak with her dr today. Dr Ronne Binning sees her but she is out of the office today until Monday for the holiday. Will inform Dr Ronne Binning she called.

## 2021-09-14 ENCOUNTER — Telehealth (HOSPITAL_COMMUNITY): Payer: Self-pay | Admitting: Psychiatry

## 2021-09-14 ENCOUNTER — Other Ambulatory Visit (HOSPITAL_COMMUNITY): Payer: Self-pay | Admitting: Psychiatry

## 2021-09-14 DIAGNOSIS — F411 Generalized anxiety disorder: Secondary | ICD-10-CM

## 2021-09-14 MED ORDER — TRAZODONE HCL 50 MG PO TABS: 50.0000 mg | ORAL_TABLET | Freq: Every day | ORAL | 3 refills | Status: DC

## 2021-09-14 MED ORDER — HYDROXYZINE HCL 10 MG PO TABS: 10.0000 mg | ORAL_TABLET | Freq: Three times a day (TID) | ORAL | 3 refills | Status: DC | PRN

## 2021-09-14 NOTE — Telephone Encounter (Signed)
Patient called office on 11/25 and left a voicemail stating that she needs to schedule an immediate appt. Writer returned phone call on today to check on patient. Patient explained that she needed to speak with her psych and therapist ASAP. Writer informed that she has an appt on 11/30 with therapist and 12/8 with psychiatrist. Patient wants to speak with psych sooner due to recent news of son having a stroke and dad having cancer.

## 2021-09-14 NOTE — Telephone Encounter (Signed)
Patient informed Probation officer that recently her son had a stroke and was hospitalized.  She notes that she continues to care for his children and is feeling overwhelmed.  She notes that she cries a lot most days and at times have difficulty sleeping.  She requested Xanax be increased.  Provider informed patient that Xanax would not be increased at this time however offered trazodone 50 mg nightly to help manage sleep and hydroxyzine 10 mg 3 times daily as needed to help manage anxiety.  She was agreeable to these recommendations. Potential side effects of medication and risks vs benefits of treatment vs non-treatment were explained and discussed. All questions were answered.  She notes that she has seen a therapist and reports that is going well.  She plans to follow-up with her therapist on 09/16/2021.  No other concerns at this time

## 2021-09-15 ENCOUNTER — Telehealth (HOSPITAL_COMMUNITY): Payer: Self-pay | Admitting: Family Medicine

## 2021-09-15 NOTE — BH Assessment (Signed)
Care Management - Outpatient Services   Eulis Canner, NP requested that the patient receive a call and follow up with senior housing resources for the patient.   Writer made contact with the patient.  Patient reports that she does not need any assistance with housing.   Writer informed NP

## 2021-09-16 ENCOUNTER — Ambulatory Visit (INDEPENDENT_AMBULATORY_CARE_PROVIDER_SITE_OTHER): Payer: Medicare HMO | Admitting: Licensed Clinical Social Worker

## 2021-09-16 DIAGNOSIS — F411 Generalized anxiety disorder: Secondary | ICD-10-CM | POA: Diagnosis not present

## 2021-09-21 NOTE — Progress Notes (Signed)
THERAPIST PROGRESS NOTE   Virtual Visit via Video Note  I connected with Cassandra Lynch on 09/16/21 at  3:00 PM EST by a video enabled telemedicine application and verified that I am speaking with the correct person using two identifiers.  Location: Patient: Home Provider: [facility]   I discussed the limitations of evaluation and management by telemedicine and the availability of in person appointments. The patient expressed understanding and agreed to proceed. I discussed the assessment and treatment plan with the patient. The patient was provided an opportunity to ask questions and all were answered. The patient agreed with the plan and demonstrated an understanding of the instructions.   The patient was advised to call back or seek an in-person evaluation if the symptoms worsen or if the condition fails to improve as anticipated.  I provided 55 min minutes of non-face-to-face time during this encounter.  Participation Level: Active  Behavioral Response: CasualAlertAnxious and Depressed  Type of Therapy: Individual Therapy  Treatment Goals addressed: Communication: anx/dep/coping  Interventions: Supportive  Summary: Cassandra Lynch is a 65 y.o. female who presents with hx of anx.  This date patient logs for video session per schedule.  LCSW immediately states awareness patient's son, Cassandra Lynch, has had a stroke.  LCSW advised by medication management provider.  Cassandra Lynch confirms son had a stroke approximately 4 weeks ago.  Cassandra Lynch advises he had experienced a bad headache for 2 weeks and was using over-the-counter medications to manage pain, refusing to seek medical care.  Cassandra Lynch reports Cassandra Lynch is 65 years old. Cassandra Lynch states he was in the hospital for a week.  He has lost peripheral vision and lost the ability to read and write but is otherwise functional.  Cassandra Lynch reports he was even unable to read a children's book to his child. LCSW assessed for son getting appropriate therapies. Pt will f/u as Cassandra Lynch does  not believe he is. Patient further states that while being worked up for the stroke he was found to have a nonfunctioning heart valve and is scheduled for surgery December 14 for a mechanical valve placement.  Patient is extremely distressed, anxious and fearful.  Cassandra Lynch reports her son has been in the picture of health, works out all the time, and eats extremely healthy food. Cassandra Lynch reports her son is angry and irritable and has asked her to "stay strong for me".  Patient is very tearful and feels Cassandra Lynch cannot cry in front of her son. Cassandra Lynch states Cassandra Lynch does not know how Cassandra Lynch will manage seeing him in the ICU. Cassandra Lynch reports her 2 best friends with whom Cassandra Lynch talks to the most have both lost children so Cassandra Lynch feels Cassandra Lynch cannot express her despair/worry to them.  Cassandra Lynch reports her parents are aware and Cassandra Lynch can talk to them minimally saying her mother has an extreme anxiety problem.  Cassandra Lynch also states Cassandra Lynch found out 2 days ago her father has a cancerous tumor in his mouth and he too will have to have surgery.  Cassandra Lynch reports her brother is managing the care coordination for father and his surgery will be in [place] since they just moved to [place].  Cassandra Lynch states "all the problems I thought I had are gone". (Cassandra Lynch states Cassandra Lynch did get her $1000 back from the bank and Cassandra Lynch when asked) LCSW assisted patient to process all thoughts and feelings. Validated her distress. When asked, Cassandra Lynch reports Cassandra Lynch is still going to see her daughter in [place] in the coming days as her son  has insisted Cassandra Lynch keep this plan.  LCSW addressed coping strategies.  Patient will consider journaling.  Learned patient has a small dog named Cassandra Lynch who brings her comfort. Relies on her faith. LCSW reviewed poc including scheduling prior to close of session. Pt states appreciation for care.   Suicidal/Homicidal: Nowithout intent/plan  Therapist Response: Pt receptive to care.  Plan: Return again for next avail appt.  Diagnosis: Axis I: Generalized Anxiety  Disorder   Cassandra Messick, LCSW 09/21/2021

## 2021-09-24 ENCOUNTER — Encounter (HOSPITAL_COMMUNITY): Payer: Self-pay | Admitting: Psychiatry

## 2021-09-24 ENCOUNTER — Telehealth (INDEPENDENT_AMBULATORY_CARE_PROVIDER_SITE_OTHER): Payer: Medicare HMO | Admitting: Psychiatry

## 2021-09-24 DIAGNOSIS — F32A Depression, unspecified: Secondary | ICD-10-CM

## 2021-09-24 DIAGNOSIS — F411 Generalized anxiety disorder: Secondary | ICD-10-CM | POA: Diagnosis not present

## 2021-09-24 MED ORDER — TRAZODONE HCL 50 MG PO TABS
50.0000 mg | ORAL_TABLET | Freq: Every day | ORAL | 3 refills | Status: DC
Start: 2021-09-24 — End: 2021-12-14

## 2021-09-24 MED ORDER — HYDROXYZINE HCL 10 MG PO TABS: 10.0000 mg | ORAL_TABLET | Freq: Three times a day (TID) | ORAL | 3 refills | Status: DC | PRN

## 2021-09-24 MED ORDER — VENLAFAXINE HCL ER 75 MG PO CP24
225.0000 mg | ORAL_CAPSULE | Freq: Every day | ORAL | 3 refills | Status: DC
Start: 2021-09-24 — End: 2021-12-14

## 2021-09-24 MED ORDER — LAMOTRIGINE 200 MG PO TABS: 200.0000 mg | ORAL_TABLET | Freq: Every day | ORAL | 3 refills | Status: DC

## 2021-09-24 MED ORDER — ARIPIPRAZOLE 5 MG PO TABS: 5.0000 mg | ORAL_TABLET | Freq: Every day | ORAL | 3 refills | Status: DC

## 2021-09-24 NOTE — Progress Notes (Signed)
BH MD/PA/NP OP Progress Note Virtual Visit via Video Note  I connected with Cassandra Lynch on 09/24/21 at  1:30 PM EST by a video enabled telemedicine application and verified that I am speaking with the correct person using two identifiers.  Location: Patient: Home Provider: Clinic   I discussed the limitations of evaluation and management by telemedicine and the availability of in person appointments. The patient expressed understanding and agreed to proceed.  I provided 30 minutes of non-face-to-face time during this encounter.          09/24/2021 1:55 PM Cassandra Lynch  MRN:  [id number]  Chief Complaint: "The night time medications are great but I still have some anxiety"  HPI: 65 year old female seen today for follow up psychiatric evaluation. She has a psychiatric history of anxiety and depression. She is currently prescribed Lamictal 200 mg daily, Effexor 225 mg daily,hydroxyzine 10 mg three times daily, Trazodone 50 mg nightly as needed, Xanax 0.5 mg daily,  and Abilify 5 mg daily.  Today she reported that her medications are effective in managing her psychiatric conditions but reports she is having increased anxiety.  Today she is pleasant, cooperative, engaged in conversation, and maintained eye contact. She informed Probation officer that Trazodone has been working great. She reports that she is now sleeping 7 hours nightly. She does note that she continues to have increased anxiety regarding her son and fathers health. Patient notes her son will be having open heart surgery in a few weeks. She also notes that her father will be having surgery on December 28th. Patient reports that her son is no longer hospitalized but is home and tired most days. She informed Probation officer that the family is postponing Christmas to focus on there ill loved ones.   Patient notes that the above is the  main source of her anxiety and depression. She notes that counseling has been helpful in managing her stressors.  Today Provider conducted a Gad 7 and patient scored an 18, at her last visit she scored a 9. Provider also conducted a PHQ 9 and patient scored a 13, at her last visit she scored a 5. She endorses having a decreased appetite and has lost 5 pounds since her last visit.  Today no medication changes made. Provider discussed reducing/discontinuing Xanax in the new year. She endorsed understanding and agreed. Xanax not refilled today as it was recently refilled. She will continue all medications as prescribed and follow up with outpatient counseling for therapy. No other concerns noted at this time.     Visit Diagnosis:    ICD-10-CM   1. Mild depression  F32.A ARIPiprazole (ABILIFY) 5 MG tablet    lamoTRIgine (LAMICTAL) 200 MG tablet    traZODone (DESYREL) 50 MG tablet    venlafaxine XR (EFFEXOR-XR) 75 MG 24 hr capsule    2. Generalized anxiety disorder  F41.1 hydrOXYzine (ATARAX) 10 MG tablet    traZODone (DESYREL) 50 MG tablet    venlafaxine XR (EFFEXOR-XR) 75 MG 24 hr capsule      Past Psychiatric History: Anxiety and depression  Past Medical History:  Past Medical History:  Diagnosis Date   Abnormal Pap smear of cervix 09/17/2015   LSIL CIN1 HR HPV:+detected   Allergy    Anxiety    Depression    Fibroids 05/2000   Personal history of colonic adenomas 08/27/2013   Smoker     Past Surgical History:  Procedure Laterality Date   CESAREAN SECTION     x 2  CHOLECYSTECTOMY, LAPAROSCOPIC  1989   COLONOSCOPY     FINGER SURGERY Right 2007   pointer, severed tendons   POLYPECTOMY      Family Psychiatric History: Mother depression, sister depression, brother depression   Family History:  Family History  Problem Relation Age of Onset   Colon polyps Father    Colon polyps Brother    Heart disease Brother    Heart disease Mother    Colon cancer Neg Hx    Esophageal cancer Neg Hx    Rectal cancer Neg Hx    Stomach cancer Neg Hx     Social History:  Social History    Socioeconomic History   Marital status: Divorced    Spouse name: Not on file   Number of children: 2   Years of education: Not on file   Highest education level: Not on file  Occupational History   Not on file  Tobacco Use   Smoking status: Every Day    Packs/day: 1.00    Years: 30.00    Pack years: 30.00    Types: Cigarettes   Smokeless tobacco: Never  Vaping Use   Vaping Use: Never used  Substance and Sexual Activity   Alcohol use: Yes    Alcohol/week: 1.0 standard drink    Types: 1 Glasses of wine per week    Comment: occasionally   Drug use: No   Sexual activity: Not Currently    Partners: Male    Birth control/protection: Post-menopausal  Other Topics Concern   Not on file  Social History Narrative   Not on file   Social Determinants of Health   Financial Resource Strain: Not on file  Food Insecurity: Not on file  Transportation Needs: Not on file  Physical Activity: Not on file  Stress: Not on file  Social Connections: Not on file    Allergies:  Allergies  Allergen Reactions   Biaxin [Clarithromycin] Hives   Ceftin [Cefuroxime Axetil]    Vancomycin     Metabolic Disorder Labs: Lab Results  Component Value Date   HGBA1C 5.4 02/05/2020   MPG 103 11/12/2016   No results found for: PROLACTIN Lab Results  Component Value Date   CHOL 187 02/05/2020   TRIG 145 02/05/2020   HDL 52 02/05/2020   CHOLHDL 3.6 02/05/2020   VLDL 27 11/12/2016   LDLCALC 109 (H) 02/05/2020   LDLCALC 162 (H) 11/12/2016   Lab Results  Component Value Date   TSH 3.650 02/05/2020   TSH 3.48 11/12/2016    Therapeutic Level Labs: No results found for: LITHIUM No results found for: VALPROATE No components found for:  CBMZ  Current Medications: Current Outpatient Medications  Medication Sig Dispense Refill   ALPRAZolam (XANAX) 0.5 MG tablet TAKE 1 TABLET BY MOUTH EVERY NIGHT AT BEDTIME AS NEEDED FOR ANXIETY 30 tablet 1   ARIPiprazole (ABILIFY) 5 MG tablet Take 1  tablet (5 mg total) by mouth daily. Reported on 11/21/2015 30 tablet 3   Desloratadine-Pseudoephedrine (CLARINEX-D 12 HOUR PO) Take 1 tablet by mouth 2 (two) times daily.     hydrOXYzine (ATARAX) 10 MG tablet Take 1 tablet (10 mg total) by mouth 3 (three) times daily as needed. 90 tablet 3   lamoTRIgine (LAMICTAL) 200 MG tablet Take 1 tablet (200 mg total) by mouth daily. 30 tablet 3   traZODone (DESYREL) 50 MG tablet Take 1 tablet (50 mg total) by mouth at bedtime. 30 tablet 3   venlafaxine XR (EFFEXOR-XR) 75 MG 24 hr  capsule Take 3 capsules (225 mg total) by mouth daily with breakfast. 90 capsule 3   Current Facility-Administered Medications  Medication Dose Route Frequency Provider Last Rate Last Admin   0.9 %  sodium chloride infusion  500 mL Intravenous Once Gatha Mayer, MD         Musculoskeletal: Strength & Muscle Tone:  Unable to assess due to telehealth visit [place]:  Unable to assess due to telehealth visit Patient leans: N/A  Psychiatric Specialty Exam: Review of Systems  Last menstrual period 12/17/2007.There is no height or weight on file to calculate BMI.  General Appearance: Well Groomed  Eye Contact:  Good  Speech:  Clear and Coherent and Normal Rate  Volume:  Normal  Mood:  Anxious and Depressed  Affect:  Appropriate and Congruent  Thought Process:  Coherent, Goal Directed and Linear  Orientation:  Full (Time, Place, and Person)  Thought Content: WDL and Logical   Suicidal Thoughts:  No  Homicidal Thoughts:  No  Memory:  Immediate;   Good Recent;   Good Remote;   Good  Judgement:  Good  Insight:  Good  Psychomotor Activity:  Normal  Concentration:  Concentration: Good and Attention Span: Good  Recall:  Good  Fund of Knowledge: Good  Language: Good  Akathisia:  No  Handed:  Right  AIMS (if indicated): Not Done  Assets:  Communication Skills Desire for Improvement Financial Resources/Insurance Housing Social Support  ADL's:  Intact   Cognition: WNL  Sleep:  Good   Screenings: GAD-7    Flowsheet Row Video Visit from 09/24/2021 in [facility] Video Visit from 06/18/2021 in [facility] Video Visit from 01/20/2021 in [facility] Video Visit from 10/21/2020 in [facility] Video Visit from 09/17/2020 in [facility]  Total GAD-7 Score 18 9 14 9 10       PHQ2-9    Flowsheet Row Video Visit from 09/24/2021 in [facility] Counselor from 06/26/2021 in [facility] Video Visit from 06/18/2021 in [facility] Video Visit from 01/20/2021 in [facility] Video Visit from 10/21/2020 in [place]  PHQ-2 Total Score 6 2 1 3 1   PHQ-9 Total Score 13 6 5 7 2       Flowsheet Row Video Visit from 09/24/2021 in [facility] Counselor from 06/26/2021 in [place] Error: Q7 should not be populated when Q6 is No No Risk        Assessment and Plan: Patient notes that she has been more anxious and depressed due to the health and upcoming surgeries of her son and father. Today no medication changes made. Provider discussed reducing/discontinuing Xanax in the new year. She endorsed understanding and agreed. Xanax not refilled today as it was recently refilled. She will continue all medications as prescribed   1. Generalized anxiety disorder  Continue- hydrOXYzine (ATARAX) 10 MG tablet; Take 1 tablet (10 mg total) by mouth 3 (three) times daily as needed.  Dispense: 90 tablet; Refill: 3 Continue- traZODone (DESYREL) 50 MG tablet; Take 1 tablet (50 mg total) by mouth at bedtime.  Dispense: 30 tablet; Refill: 3 Continue- venlafaxine XR (EFFEXOR-XR) 75 MG 24 hr capsule; Take 3 capsules (225 mg total) by  mouth daily with breakfast.  Dispense: 90 capsule; Refill: 3  2. Mild  depression  Continue- ARIPiprazole (ABILIFY) 5 MG tablet; Take 1 tablet (5 mg total) by mouth daily. Reported on 11/21/2015  Dispense: 30 tablet; Refill: 3 Continue- lamoTRIgine (LAMICTAL) 200 MG tablet; Take 1 tablet (200 mg total) by mouth daily.  Dispense: 30 tablet; Refill: 3 Continue- traZODone (DESYREL) 50 MG tablet; Take 1 tablet (50 mg total) by mouth at bedtime.  Dispense: 30 tablet; Refill: 3 Continue- venlafaxine XR (EFFEXOR-XR) 75 MG 24 hr capsule; Take 3 capsules (225 mg total) by mouth daily with breakfast.  Dispense: 90 capsule; Refill: 3   Follow up in 3 months  Follow up with therapy  Salley Slaughter, NP 09/24/2021, 1:55 PM

## 2021-10-01 ENCOUNTER — Ambulatory Visit (HOSPITAL_COMMUNITY): Payer: Medicare HMO | Admitting: Licensed Clinical Social Worker

## 2021-10-01 ENCOUNTER — Encounter (HOSPITAL_COMMUNITY): Payer: Self-pay

## 2021-10-01 ENCOUNTER — Other Ambulatory Visit (HOSPITAL_COMMUNITY): Payer: Self-pay | Admitting: Psychiatry

## 2021-10-01 DIAGNOSIS — F411 Generalized anxiety disorder: Secondary | ICD-10-CM

## 2021-10-09 ENCOUNTER — Ambulatory Visit (HOSPITAL_COMMUNITY): Payer: Medicare HMO | Admitting: Licensed Clinical Social Worker

## 2021-10-09 ENCOUNTER — Telehealth (HOSPITAL_COMMUNITY): Payer: Self-pay | Admitting: Licensed Clinical Social Worker

## 2021-10-09 NOTE — Telephone Encounter (Signed)
LCSW sent text message with link for video session per schedule. LCSW remained available for session online until 8:14am. Pt failed to sign on for session.

## 2021-10-20 ENCOUNTER — Other Ambulatory Visit: Payer: Self-pay

## 2021-10-20 ENCOUNTER — Encounter (HOSPITAL_BASED_OUTPATIENT_CLINIC_OR_DEPARTMENT_OTHER): Payer: Self-pay | Admitting: Emergency Medicine

## 2021-10-20 ENCOUNTER — Emergency Department (HOSPITAL_BASED_OUTPATIENT_CLINIC_OR_DEPARTMENT_OTHER)
Admission: EM | Admit: 2021-10-20 | Discharge: 2021-10-20 | Disposition: A | Discharge status: Home / Self Care | Payer: Medicare HMO | Attending: Emergency Medicine | Admitting: Emergency Medicine

## 2021-10-20 DIAGNOSIS — K0889 Other specified disorders of teeth and supporting structures: Secondary | ICD-10-CM | POA: Diagnosis present

## 2021-10-20 DIAGNOSIS — K029 Dental caries, unspecified: Secondary | ICD-10-CM | POA: Insufficient documentation

## 2021-10-20 MED ORDER — CLINDAMYCIN HCL 300 MG PO CAPS: 300.0000 mg | ORAL_CAPSULE | Freq: Three times a day (TID) | ORAL | 0 refills | Status: AC

## 2021-10-20 NOTE — ED Triage Notes (Signed)
Pt reports pain to RT upper gums that started yesterday, noticed swelling this morning

## 2021-10-20 NOTE — ED Notes (Signed)
Dental pain x 2 days

## 2021-10-20 NOTE — ED Provider Notes (Signed)
[facility] EMERGENCY DEPARTMENT Provider Note   CSN: [id number] Arrival date & time: 10/20/21  1634     History  Chief Complaint  Patient presents with   Dental Pain    Cassandra Lynch is a 66 y.o. female.  The history is provided by the patient.  Dental Pain Location:  Upper Quality:  Aching Severity:  Mild Chronicity:  New Context: dental caries   Worsened by:  Nothing Ineffective treatments:  None tried Associated symptoms: no congestion, no difficulty swallowing, no drooling, no facial pain, no facial swelling, no fever, no gum swelling, no headaches, no neck pain, no neck swelling, no oral bleeding, no oral lesions and no trismus   Risk factors: lack of dental care       Home Medications Prior to Admission medications   Medication Sig Start Date End Date Taking? Authorizing Provider  clindamycin (CLEOCIN) 300 MG capsule Take 1 capsule (300 mg total) by mouth 3 (three) times daily for 10 days. 10/20/21 10/30/21 Yes Tiffinie Caillier, DO  ALPRAZolam Duanne Moron) 0.5 MG tablet TAKE 1 TABLET BY MOUTH AT BEDTIME AS NEEDED FOR ANXIETY 10/01/21   Eulis Canner E, NP  ARIPiprazole (ABILIFY) 5 MG tablet Take 1 tablet (5 mg total) by mouth daily. Reported on 11/21/2015 09/24/21   Salley Slaughter, NP  Desloratadine-Pseudoephedrine (CLARINEX-D 12 HOUR PO) Take 1 tablet by mouth 2 (two) times daily.    [provider]  hydrOXYzine (ATARAX) 10 MG tablet Take 1 tablet (10 mg total) by mouth 3 (three) times daily as needed. 09/24/21   Salley Slaughter, NP  lamoTRIgine (LAMICTAL) 200 MG tablet Take 1 tablet (200 mg total) by mouth daily. 09/24/21   Salley Slaughter, NP  traZODone (DESYREL) 50 MG tablet Take 1 tablet (50 mg total) by mouth at bedtime. 09/24/21   Salley Slaughter, NP  venlafaxine XR (EFFEXOR-XR) 75 MG 24 hr capsule Take 3 capsules (225 mg total) by mouth daily with breakfast. 09/24/21   Salley Slaughter, NP      Allergies    Biaxin [clarithromycin],  Ceftin [cefuroxime axetil], and Vancomycin    Review of Systems   Review of Systems  Constitutional:  Negative for fever.  HENT:  Negative for congestion, drooling, facial swelling and mouth sores.   Musculoskeletal:  Negative for neck pain.  Neurological:  Negative for headaches.   Physical Exam Updated Vital Signs BP 102/78 (BP Location: Left Arm)    Pulse 90    Temp 98 F (36.7 C) (Oral)    Resp 16    Ht 5\' 4"  (1.626 m)    Wt 72.6 kg    LMP 12/17/2007 (Approximate)    SpO2 95%    BMI 27.46 kg/m  Physical Exam Constitutional:      General: She is not in acute distress.    Appearance: She is not ill-appearing.  HENT:     Head: Normocephalic and atraumatic.     Comments: Dental caries throughout upper teeth, no obvious abscess or trismus or submandibular swelling    Nose: Nose normal.     Mouth/Throat:     Mouth: Mucous membranes are moist.  Eyes:     Pupils: Pupils are equal, round, and reactive to light.  Cardiovascular:     Pulses: Normal pulses.  Musculoskeletal:     Cervical back: Normal range of motion. No tenderness.  Neurological:     Mental Status: She is alert.    ED Results / Procedures / Treatments  Labs (all labs ordered are listed, but only abnormal results are displayed) Labs Reviewed - No data to display  EKG None  Radiology No results found.  Procedures Procedures    Medications Ordered in ED Medications - No data to display  ED Course/ Medical Decision Making/ A&P                           Medical Decision Making  KEIANA TAVELLA is here with dental pain.  Multiple dental caries in the upper teeth.  There is no concern for abscess or other deep space infection of the face or mouth.  Will prescribe clindamycin.  Given resources for dentistry follow-up.  This has been ongoing for several weeks.  Normal vitals.  No fever.  Discharged in good condition.  Exam is unremarkable.        Final Clinical Impression(s) / ED Diagnoses Final  diagnoses:  Dental caries    Rx / DC Orders ED Discharge Orders          Ordered    clindamycin (CLEOCIN) 300 MG capsule  3 times daily        10/20/21 1948              Lennice Sites, DO 10/20/21 1949

## 2021-11-11 ENCOUNTER — Ambulatory Visit (INDEPENDENT_AMBULATORY_CARE_PROVIDER_SITE_OTHER): Payer: Medicare HMO | Admitting: Licensed Clinical Social Worker

## 2021-11-11 DIAGNOSIS — F411 Generalized anxiety disorder: Secondary | ICD-10-CM

## 2021-11-12 NOTE — Progress Notes (Signed)
THERAPIST PROGRESS NOTE   Virtual Visit via Video Note  I connected with Cassandra Lynch on 11/11/21 at  4:00 PM EST by a video enabled telemedicine application and verified that I am speaking with the correct person using two identifiers.  Location: Patient: Home Provider: [facility]   I discussed the limitations of evaluation and management by telemedicine and the availability of in person appointments. The patient expressed understanding and agreed to proceed. I discussed the assessment and treatment plan with the patient. The patient was provided an opportunity to ask questions and all were answered. The patient agreed with the plan and demonstrated an understanding of the instructions.   The patient was advised to call back or seek an in-person evaluation if the symptoms worsen or if the condition fails to improve as anticipated.  I provided 40 minutes of non-face-to-face time during this encounter.  Participation Level: Active  Behavioral Response: CasualAlertAnxious  Type of Therapy: Individual Therapy  Treatment Goals addressed: Anxiety and Coping  Interventions: Solution Focused, Supportive, and Other: additional assessment  Summary: Cassandra Lynch is a 66 y.o. female who presents with hx of anx.  Today patient logs on for video session.  Last successful session with patient was September 16, 2021.  LCSW assessed for overall status.  Patient reports she is in a good mood at the present time because she is leaving for [facility] tomorrow to visit her daughter.  She reports she will be there from Thursday to Monday and will also be seeing a group of really good friends for a music event while she is there.  Patient reports her son, Cassandra Lynch, is 6 weeks post surgery.  She reports he still has very limited peripheral vision and struggles with reading.  LCSW assessed for son getting PT and OT.  She states this is still not something that has happened despite multiple doctor follow-ups.  She  reports her son was not interested when she mentioned it.  She also reports son continues to have significant frustration and anger.  LCSW encouraged further exploration of obtaining PT and OT services.  Patient reports her son is working half a day now with difficulty and the cardiac surgeon has given him permission to drive.  Patient states he is seeing an infectious disease specialist as he was found to have an infection around his heart which is what caused the heart valve deterioration.  She reports he has been on IV antibiotics via a port since surgery and he hopes to have meds completed tomorrow.  Cassandra Lynch continues to help with the grandchildren and states she treads lightly on what she can and cannot say with clint.  She continues to be concerned about the bullying of her grandson by another child which was discussed in some detail.  LCSW assessed for outcome of father's surgery.  She reports surgery completed and he does have stage III squamous cell cancer.  Father and mother are struggling to cope, particularly mother.  Cassandra Lynch will ask mother if she is willing to see a counselor.  Patient is doing a lot to assist with parents needs on a very regular basis.  Patient reports one of her close friends mother just died and that has been another stressor.  LCSW assisted patient to process thoughts and feelings.  Provided referral to family caregiver alliance.  Provided information on grief counseling center at hospice for her to share with her friend.  Encourage patient to leave stressors behind for her trip to  [place] and stay immersed in self-care, fun. LCSW reviewed poc including scheduling prior to close of session. Pt states appreciation for care.   Suicidal/Homicidal: Nowithout intent/plan  Therapist Response: Pt receptive to care.  Plan: Return again in ~3 weeks.  Diagnosis: Axis I: Generalized Anxiety Disorder  Hermine Messick, LCSW 11/12/2021

## 2021-11-30 ENCOUNTER — Other Ambulatory Visit (HOSPITAL_COMMUNITY): Payer: Self-pay | Admitting: Psychiatry

## 2021-11-30 DIAGNOSIS — F411 Generalized anxiety disorder: Secondary | ICD-10-CM

## 2021-12-03 ENCOUNTER — Ambulatory Visit (HOSPITAL_COMMUNITY): Payer: Medicare HMO | Admitting: Licensed Clinical Social Worker

## 2021-12-03 ENCOUNTER — Encounter (HOSPITAL_COMMUNITY): Payer: Self-pay

## 2021-12-14 ENCOUNTER — Encounter (HOSPITAL_COMMUNITY): Payer: Self-pay | Admitting: Psychiatry

## 2021-12-14 ENCOUNTER — Telehealth (INDEPENDENT_AMBULATORY_CARE_PROVIDER_SITE_OTHER): Payer: Medicare HMO | Admitting: Psychiatry

## 2021-12-14 DIAGNOSIS — F411 Generalized anxiety disorder: Secondary | ICD-10-CM

## 2021-12-14 DIAGNOSIS — F32A Depression, unspecified: Secondary | ICD-10-CM | POA: Diagnosis not present

## 2021-12-14 MED ORDER — ARIPIPRAZOLE 5 MG PO TABS: 5.0000 mg | ORAL_TABLET | Freq: Every day | ORAL | 3 refills | Status: DC

## 2021-12-14 MED ORDER — LAMOTRIGINE 200 MG PO TABS: 200.0000 mg | ORAL_TABLET | Freq: Every day | ORAL | 3 refills | Status: DC

## 2021-12-14 MED ORDER — VENLAFAXINE HCL ER 75 MG PO CP24: 225.0000 mg | ORAL_CAPSULE | Freq: Every day | ORAL | 3 refills | Status: DC

## 2021-12-14 NOTE — Progress Notes (Signed)
BH MD/PA/NP OP Progress Note Virtual Visit via Video Note  I connected with Cassandra Lynch on 12/14/21 at  3:00 PM EST by a video enabled telemedicine application and verified that I am speaking with the correct person using two identifiers.  Location: Patient: Home Provider: Clinic   I discussed the limitations of evaluation and management by telemedicine and the availability of in person appointments. The patient expressed understanding and agreed to proceed.  I provided 30 minutes of non-face-to-face time during this encounter.          12/14/2021 3:45 PM Cassandra Lynch  MRN:  [id number]  Chief Complaint: "Problems with my parents have surfaced but I'm doing okay"  HPI: 66 year old female seen today for follow up psychiatric evaluation. She has a psychiatric history of anxiety and depression. She is currently prescribed Lamictal 200 mg daily, Effexor 225 mg daily,hydroxyzine 10 mg three times daily, Trazodone 50 mg nightly as needed, Xanax 0.5 mg daily,  and Abilify 5 mg daily.  Today she reported that she is not taking Xanax, hydroxyzine, or trazodone in over a month.  Today she notes her medications are effective in managing her psychiatric conditions.    Today she is pleasant, cooperative, engaged in conversation, and maintained eye contact. She informed Probation officer that problems with her parents have been surfacing.  She notes that recently her elderly parents sold their home in [place] and moved into a rental property that her son owns here in [place].  She notes that her parents are dissatisfied with the move however reports that she and her siblings feel like the move is what is best for their parents.  Since her last visit patient notes that she has discontinued trazodone, hydroxyzine, and Xanax.  She informed Probation officer that her mood is stable and has minimal depression.  At times she notes that she is worried about finances, her son (who had surgery on his heart), her  family, and her health (notes that she was recently started on medication for blood pressure) but notes that she is able to cope with it.  She endorses adequate sleep and appetite.  Today she denies SI/HI/VAH, mania, paranoia.    At last visit provider discussed reducing/discontinuing Xanax.  Xanax discontinued today.  At this time patient does not want to restart hydroxyzine or trazodone.  She will continue Effexor, Lamictal, and Abilify as prescribed and will follow-up with outpatient counseling for therapy.  No other concerns at this time.     Visit Diagnosis:    ICD-10-CM   1. Generalized anxiety disorder  F41.1 venlafaxine XR (EFFEXOR-XR) 75 MG 24 hr capsule    2. Mild depression  F32.A venlafaxine XR (EFFEXOR-XR) 75 MG 24 hr capsule    lamoTRIgine (LAMICTAL) 200 MG tablet    ARIPiprazole (ABILIFY) 5 MG tablet      Past Psychiatric History: Anxiety and depression  Past Medical History:  Past Medical History:  Diagnosis Date   Abnormal Pap smear of cervix 09/17/2015   LSIL CIN1 HR HPV:+detected   Allergy    Anxiety    Depression    Fibroids 05/2000   Personal history of colonic adenomas 08/27/2013   Smoker     Past Surgical History:  Procedure Laterality Date   CESAREAN SECTION     x 2   CHOLECYSTECTOMY, LAPAROSCOPIC  1989   COLONOSCOPY     FINGER SURGERY Right 2007   pointer, severed tendons   POLYPECTOMY      Family Psychiatric History:  Mother depression, sister depression, brother depression   Family History:  Family History  Problem Relation Age of Onset   Colon polyps Father    Colon polyps Brother    Heart disease Brother    Heart disease Mother    Colon cancer Neg Hx    Esophageal cancer Neg Hx    Rectal cancer Neg Hx    Stomach cancer Neg Hx     Social History:  Social History   Socioeconomic History   Marital status: Divorced    Spouse name: Not on file   Number of children: 2   Years of education: Not on file   Highest education level: Not on  file  Occupational History   Not on file  Tobacco Use   Smoking status: Every Day    Packs/day: 1.00    Years: 30.00    Pack years: 30.00    Types: Cigarettes   Smokeless tobacco: Never  Vaping Use   Vaping Use: Never used  Substance and Sexual Activity   Alcohol use: Yes    Alcohol/week: 1.0 standard drink    Types: 1 Glasses of wine per week    Comment: occasionally   Drug use: No   Sexual activity: Not Currently    Partners: Male    Birth control/protection: Post-menopausal  Other Topics Concern   Not on file  Social History Narrative   Not on file   Social Determinants of Health   Financial Resource Strain: Not on file  Food Insecurity: Not on file  Transportation Needs: Not on file  Physical Activity: Not on file  Stress: Not on file  Social Connections: Not on file    Allergies:  Allergies  Allergen Reactions   Biaxin [Clarithromycin] Hives   Ceftin [Cefuroxime Axetil]    Vancomycin     Metabolic Disorder Labs: Lab Results  Component Value Date   HGBA1C 5.4 02/05/2020   MPG 103 11/12/2016   No results found for: PROLACTIN Lab Results  Component Value Date   CHOL 187 02/05/2020   TRIG 145 02/05/2020   HDL 52 02/05/2020   CHOLHDL 3.6 02/05/2020   VLDL 27 11/12/2016   LDLCALC 109 (H) 02/05/2020   LDLCALC 162 (H) 11/12/2016   Lab Results  Component Value Date   TSH 3.650 02/05/2020   TSH 3.48 11/12/2016    Therapeutic Level Labs: No results found for: LITHIUM No results found for: VALPROATE No components found for:  CBMZ  Current Medications: Current Outpatient Medications  Medication Sig Dispense Refill   ARIPiprazole (ABILIFY) 5 MG tablet Take 1 tablet (5 mg total) by mouth daily. Reported on 11/21/2015 30 tablet 3   Desloratadine-Pseudoephedrine (CLARINEX-D 12 HOUR PO) Take 1 tablet by mouth 2 (two) times daily.     lamoTRIgine (LAMICTAL) 200 MG tablet Take 1 tablet (200 mg total) by mouth daily. 30 tablet 3   venlafaxine XR  (EFFEXOR-XR) 75 MG 24 hr capsule Take 3 capsules (225 mg total) by mouth daily with breakfast. 90 capsule 3   Current Facility-Administered Medications  Medication Dose Route Frequency Provider Last Rate Last Admin   0.9 %  sodium chloride infusion  500 mL Intravenous Once Gatha Mayer, MD         Musculoskeletal: Strength & Muscle Tone:  Unable to assess due to telehealth visit [place]:  Unable to assess due to telehealth visit Patient leans: N/A  Psychiatric Specialty Exam: Review of Systems  Last menstrual period 12/17/2007.There is no height or  weight on file to calculate BMI.  General Appearance: Well Groomed  Eye Contact:  Good  Speech:  Clear and Coherent and Normal Rate  Volume:  Normal  Mood:  Anxious and reports that she is able to cope  Affect:  Appropriate and Congruent  Thought Process:  Coherent, Goal Directed and Linear  Orientation:  Full (Time, Place, and Person)  Thought Content: WDL and Logical   Suicidal Thoughts:  No  Homicidal Thoughts:  No  Memory:  Immediate;   Good Recent;   Good Remote;   Good  Judgement:  Good  Insight:  Good  Psychomotor Activity:  Normal  Concentration:  Concentration: Good and Attention Span: Good  Recall:  Good  Fund of Knowledge: Good  Language: Good  Akathisia:  No  Handed:  Right  AIMS (if indicated): Not Done  Assets:  Communication Skills Desire for Improvement Financial Resources/Insurance Housing Social Support  ADL's:  Intact  Cognition: WNL  Sleep:  Good   Screenings: GAD-7    Flowsheet Row Video Visit from 12/14/2021 in [facility] Video Visit from 09/24/2021 in [facility] Video Visit from 06/18/2021 in [facility] Video Visit from 01/20/2021 in [facility] Video Visit from 10/21/2020 in [facility]  Total GAD-7 Score 13 18 9 14 9       PHQ2-9    Flowsheet  Row Video Visit from 12/14/2021 in [facility] Video Visit from 09/24/2021 in [facility] Counselor from 06/26/2021 in [facility] Video Visit from 06/18/2021 in [facility] Video Visit from 01/20/2021 in [facility] [place]  PHQ-2 Total Score 1 6 2 1 3   PHQ-9 Total Score 4 13 6 5 7       Flowsheet Row Video Visit from 12/14/2021 in [facility] ED from 10/20/2021 in [place] Video Visit from 09/24/2021 in [place] Error: Question 6 not populated No Risk Error: Q7 should not be populated when Q6 is No        Assessment and Plan: Patient notes that she has some mild anxiety due to life stressors however reports that she is able to cope with it.  She is not taking hydroxyzine, trazodone, or Xanax. At last visit provider discussed reducing/discontinuing Xanax.  Xanax discontinued today.  At this time patient does not want to restart hydroxyzine or trazodone.  She will continue Effexor, Lamictal, and Abilify as prescribed and will follow-up with outpatient counseling for therapy  1. Generalized anxiety disorder  Continue- venlafaxine XR (EFFEXOR-XR) 75 MG 24 hr capsule; Take 3 capsules (225 mg total) by mouth daily with breakfast.  Dispense: 90 capsule; Refill: 3  2. Mild depression  Continue- venlafaxine XR (EFFEXOR-XR) 75 MG 24 hr capsule; Take 3 capsules (225 mg total) by mouth daily with breakfast.  Dispense: 90 capsule; Refill: 3 Continue- lamoTRIgine (LAMICTAL) 200 MG tablet; Take 1 tablet (200 mg total) by mouth daily.  Dispense: 30 tablet; Refill: 3 Continue- ARIPiprazole (ABILIFY) 5 MG tablet; Take 1 tablet (5 mg total) by mouth daily. Reported on 11/21/2015  Dispense: 30 tablet; Refill: 3    Follow up in 3 months  Follow up with therapy  Salley Slaughter, NP 12/14/2021, 3:45 PM

## 2021-12-24 ENCOUNTER — Ambulatory Visit (INDEPENDENT_AMBULATORY_CARE_PROVIDER_SITE_OTHER): Payer: Medicare HMO | Admitting: Licensed Clinical Social Worker

## 2021-12-24 DIAGNOSIS — F411 Generalized anxiety disorder: Secondary | ICD-10-CM

## 2022-01-04 NOTE — Progress Notes (Signed)
? ?  THERAPIST PROGRESS NOTE ? ? ?Virtual Visit via Video Note ? ?I connected with Cassandra Lynch on 12/24/21 at  4:00 PM EST by a video enabled telemedicine application and verified that I am speaking with the correct person using two identifiers. ? ?Location: ?Patient: Home ?Provider: [facility] ?  ?I discussed the limitations of evaluation and management by telemedicine and the availability of in person appointments. The patient expressed understanding and agreed to proceed. ?I discussed the assessment and treatment plan with the patient. The patient was provided an opportunity to ask questions and all were answered. The patient agreed with the plan and demonstrated an understanding of the instructions. ?  ?The patient was advised to call back or seek an in-person evaluation if the symptoms worsen or if the condition fails to improve as anticipated. ? ?I provided 44 minutes of non-face-to-face time during this encounter. ? ?Participation Level: Active ? ?Behavioral Response: CasualAlertAnxious ? ?Type of Therapy: Individual Therapy ? ?Treatment Goals addressed: anx/stressors/coping ? ?ProgressTowards Goals: Progressing ? ?Interventions: Solution Focused and Supportive ? ?Summary: Cassandra Lynch is a 66 y.o. female who presents with hx of GAD.  Today patient logs on for video session per schedule.  Patient last seen November 11, 2021.  LCSW assessed for patient's trip to [place] she was leaving for day after last session.  Patient reports she had a very good time, was able to leave all her worries behind.  She reports she stayed 2 nights with her daughter and 2 nights with friends.  She notes the benefits of getting away from all her stressors to engage in some self-care. Pt taking meds as prescribed.  Cassandra Lynch reports her son is doing better.  She reports he finally is engaged in Physical Therapy and Occupational Therapy.  She also reports his irritability is improved.  Cassandra Lynch advises of new stressors with her parents care  including concerns related to dementia with her mother.  This was addressed in some detail including Cassandra Lynch taking the car keys for safety reasons.  LCSW assessed for patient's parents having advanced directives. Cassandra Lynch not positive what this means.  Education provided on advance directives in detail for patient to do her own and assist parents.  Reviewed all benefits of completing advanced directives and window of opportunity for parents. Referral to [email or web address] for more education and free forms. Pt informed of this clinician's resignation from position with [facility]. Assisted pt to process thoughts/feelings r/t change in care. Pt will have one more session with this LCSW before final termination. Pt verbalizes regret and understanding. ? ?Suicidal/Homicidal: Nowithout intent/plan ? ?Therapist Response: Pt receptive and responsive to care ? ?Plan: Return again in ~3 weeks. ? ?Diagnosis: No diagnosis found. ? ?Collaboration of Care: Community Stakeholder(s) AEB [email or web address] ? ?Patient/Guardian was advised Release of Information must be obtained prior to any record release in order to collaborate their care with an outside provider. Patient/Guardian was advised if they have not already done so to contact the registration department to sign all necessary forms in order for [place] to release information regarding their care.  ? ?Consent: Patient/Guardian gives verbal consent for treatment and assignment of benefits for services provided during this visit. Patient/Guardian expressed understanding and agreed to proceed.  ? ?Hermine Messick, LCSW ?01/04/2022 ? ?

## 2022-01-05 ENCOUNTER — Other Ambulatory Visit: Payer: Self-pay | Admitting: Adult Health Nurse Practitioner

## 2022-01-05 DIAGNOSIS — Z1231 Encounter for screening mammogram for malignant neoplasm of breast: Secondary | ICD-10-CM

## 2022-01-12 ENCOUNTER — Ambulatory Visit: Payer: Medicare HMO

## 2022-01-13 ENCOUNTER — Ambulatory Visit (HOSPITAL_COMMUNITY): Payer: Medicare HMO | Admitting: Licensed Clinical Social Worker

## 2022-01-22 ENCOUNTER — Other Ambulatory Visit (HOSPITAL_COMMUNITY): Payer: Self-pay | Admitting: Psychiatry

## 2022-01-22 DIAGNOSIS — F411 Generalized anxiety disorder: Secondary | ICD-10-CM

## 2022-01-27 ENCOUNTER — Telehealth (HOSPITAL_COMMUNITY): Payer: Self-pay | Admitting: *Deleted

## 2022-01-27 ENCOUNTER — Ambulatory Visit (HOSPITAL_COMMUNITY): Payer: Medicare HMO | Admitting: Licensed Clinical Social Worker

## 2022-01-27 NOTE — Telephone Encounter (Signed)
Call from [place] seeking a new rx for patients Xanax. Reviewed record, no current rx and when writer reviewed providers last note,note indicated Xanax was discontinued at last visit of 12/14/21. Pt. has a future appt on 02/24/22. ?

## 2022-02-24 ENCOUNTER — Telehealth (HOSPITAL_COMMUNITY): Payer: Medicare HMO | Admitting: Psychiatry

## 2022-04-14 ENCOUNTER — Ambulatory Visit (HOSPITAL_BASED_OUTPATIENT_CLINIC_OR_DEPARTMENT_OTHER): Payer: Medicare HMO | Admitting: Psychiatry

## 2022-04-14 DIAGNOSIS — F411 Generalized anxiety disorder: Secondary | ICD-10-CM | POA: Diagnosis not present

## 2022-04-14 DIAGNOSIS — F32A Depression, unspecified: Secondary | ICD-10-CM | POA: Diagnosis not present

## 2022-04-14 DIAGNOSIS — F325 Major depressive disorder, single episode, in full remission: Secondary | ICD-10-CM

## 2022-04-14 MED ORDER — ARIPIPRAZOLE 5 MG PO TABS: 5.0000 mg | ORAL_TABLET | Freq: Every day | ORAL | 3 refills | Status: DC

## 2022-04-14 MED ORDER — LORAZEPAM 0.5 MG PO TABS: ORAL_TABLET | ORAL | 3 refills | Status: DC

## 2022-04-14 MED ORDER — LAMOTRIGINE 200 MG PO TABS: 200.0000 mg | ORAL_TABLET | Freq: Every day | ORAL | 3 refills | Status: DC

## 2022-04-14 MED ORDER — VENLAFAXINE HCL ER 75 MG PO CP24: 225.0000 mg | ORAL_CAPSULE | Freq: Every day | ORAL | 3 refills | Status: DC

## 2022-04-14 NOTE — Progress Notes (Signed)
Psychiatric Initial Adult Assessment   Patient Identification: Cassandra Lynch MRN:  [id number] Date of Evaluation:  04/14/2022 Referral Source: Ronne Binning Chief Complaint: Depression    This patient is a 66 year old white divorced female who is being seen because her previous provider is no longer available.  Her therapist also is no longer available.  The patient is diagnosed most likely with major depression.  She takes a number of psychotropic medicines.  Patient describes herself as being very anxious and depressed over the last 1 month.  It began when her parents became very ill.  Her mother and father now are home after being hospitalized but they need 24-hour care.  Her sister is taking care of them but apparently the sister and the patient have a conflict and the way she is doing it.  Nonetheless the patient is very distressed that her parents are still ill.  Noted also is that her 11 year old sister son had a myocardial infarction and a stroke.  Fortunately he has a wife is very involved in his care.  His son has 2 children who the patient takes care of all the time.  They are young children.  The patient's health is very good.  The patient's biggest complaint besides being depressed is that she is having trouble falling off to sleep.  Her appetite is actually good and her energy is reasonable.  She is having no problems thinking and concentrating and she has a good sense of work.  She is not suicidal now and she never has been.  The patient denies use of alcohol or drugs.  She has never had psychosis.  She never can describe an episode of mania.  She likely had an episode of major depression probably occurred when her son was ill.  But she has been under the care a variety of her health care providers mental health care providers who have been treating her with Abilify Lamictal and Effexor.  The patient denies symptoms of generalized anxiety disorder she denies panic disorder or OCD.  She actually has  no medical illnesses.  Her past psychiatric history is noted that she has no psychiatric history of being in the hospital.  She has been under the care in the past with Dr. Yancey Flemings.  She has been in therapy but now she needs a new therapist.  The patient has been divorced since 2015.  Presently she is in no romantic relationship.  The significant distress has started in the last month with her parents being ill and the range is made to take care of him.  Apparently she has differences of opinions with her sister and how she is doing it.  The patient is not suicidal.  She is actually functioning pretty well.  She takes care of her 2 children during the day. Visit Diagnosis:    ICD-10-CM   1. Mild depression  F32.A ARIPiprazole (ABILIFY) 5 MG tablet    lamoTRIgine (LAMICTAL) 200 MG tablet    venlafaxine XR (EFFEXOR-XR) 75 MG 24 hr capsule    2. Generalized anxiety disorder  F41.1 venlafaxine XR (EFFEXOR-XR) 75 MG 24 hr capsule      History of Present Illness: See above  Associated Signs/Symptoms: Depression Symptoms:  depressed mood, (Hypo) Manic Symptoms:   Anxiety Symptoms:   Psychotic Symptoms:   PTSD Symptoms: NA  Past Psychiatric History: Multiple psychotropic medicines and psychotherapy  Previous Psychotropic Medications: Yes   Substance Abuse History in the last 12 months:  No.  Consequences of  Substance Abuse: NA  Past Medical History:  Past Medical History:  Diagnosis Date   Abnormal Pap smear of cervix 09/17/2015   LSIL CIN1 HR HPV:+detected   Allergy    Anxiety    Depression    Fibroids 05/2000   Personal history of colonic adenomas 08/27/2013   Smoker     Past Surgical History:  Procedure Laterality Date   CESAREAN SECTION     x 2   CHOLECYSTECTOMY, LAPAROSCOPIC  1989   COLONOSCOPY     FINGER SURGERY Right 2007   pointer, severed tendons   POLYPECTOMY      Family Psychiatric History:   Family History:  Family History  Problem Relation Age of  Onset   Colon polyps Father    Colon polyps Brother    Heart disease Brother    Heart disease Mother    Colon cancer Neg Hx    Esophageal cancer Neg Hx    Rectal cancer Neg Hx    Stomach cancer Neg Hx     Social History:   Social History   Socioeconomic History   Marital status: Divorced    Spouse name: Not on file   Number of children: 2   Years of education: Not on file   Highest education level: Not on file  Occupational History   Not on file  Tobacco Use   Smoking status: Every Day    Packs/day: 1.00    Years: 30.00    Total pack years: 30.00    Types: Cigarettes   Smokeless tobacco: Never  Vaping Use   Vaping Use: Never used  Substance and Sexual Activity   Alcohol use: Yes    Alcohol/week: 1.0 standard drink of alcohol    Types: 1 Glasses of wine per week    Comment: occasionally   Drug use: No   Sexual activity: Not Currently    Partners: Male    Birth control/protection: Post-menopausal  Other Topics Concern   Not on file  Social History Narrative   Not on file   Social Determinants of Health   Financial Resource Strain: Not on file  Food Insecurity: Not on file  Transportation Needs: Not on file  Physical Activity: Not on file  Stress: Not on file  Social Connections: Not on file    Additional Social History:   Allergies:   Allergies  Allergen Reactions   Biaxin [Clarithromycin] Hives   Ceftin [Cefuroxime Axetil]    Vancomycin     Metabolic Disorder Labs: Lab Results  Component Value Date   HGBA1C 5.4 02/05/2020   MPG 103 11/12/2016   No results found for: "PROLACTIN" Lab Results  Component Value Date   CHOL 187 02/05/2020   TRIG 145 02/05/2020   HDL 52 02/05/2020   CHOLHDL 3.6 02/05/2020   VLDL 27 11/12/2016   LDLCALC 109 (H) 02/05/2020   LDLCALC 162 (H) 11/12/2016   Lab Results  Component Value Date   TSH 3.650 02/05/2020    Therapeutic Level Labs: No results found for: "LITHIUM" No results found for: "CBMZ" No  results found for: "VALPROATE"  Current Medications: Current Outpatient Medications  Medication Sig Dispense Refill   LORazepam (ATIVAN) 0.5 MG tablet 1 qhs  1 qday PRN 45 tablet 3   ARIPiprazole (ABILIFY) 5 MG tablet Take 1 tablet (5 mg total) by mouth daily. Reported on 11/21/2015 30 tablet 3   Desloratadine-Pseudoephedrine (CLARINEX-D 12 HOUR PO) Take 1 tablet by mouth 2 (two) times daily.     lamoTRIgine (  LAMICTAL) 200 MG tablet Take 1 tablet (200 mg total) by mouth daily. 30 tablet 3   venlafaxine XR (EFFEXOR-XR) 75 MG 24 hr capsule Take 3 capsules (225 mg total) by mouth daily with breakfast. 90 capsule 3   Current Facility-Administered Medications  Medication Dose Route Frequency Provider Last Rate Last Admin   0.9 %  sodium chloride infusion  500 mL Intravenous Once Gatha Mayer, MD        Musculoskeletal: Strength & Muscle Tone: within normal limits Gait & Station: normal Patient leans: N/A  Psychiatric Specialty Exam: Review of Systems  Last menstrual period 12/17/2007.There is no height or weight on file to calculate BMI.  General Appearance: Casual  Eye Contact:  Good  Speech:  Clear and CoherentNormal  Volume:    Mood:  NA  Affect:  NA  Thought Process:  Goal Directed  Orientation:  NA  Thought Content:  WDL  Suicidal Thoughts:  No  Homicidal Thoughts:  No  Memory:  NA  Judgement:  Good  Insight:  Fair  Psychomotor Activity:  Normal  Concentration:    Recall:  Good  Fund of Knowledge:Good  Language: Good  Akathisia:  No  Handed:  Right  AIMS (if indicated):  not done  Assets:  Desire for Improvement  ADL's:  Intact  Cognition: WNL  Sleep:  Good   Screenings: GAD-7    Flowsheet Row Video Visit from 12/14/2021 in [facility] Video Visit from 09/24/2021 in [facility] Video Visit from 06/18/2021 in [facility] Video Visit from 01/20/2021 in [facility] Video Visit from 10/21/2020 in [facility]  Total GAD-7 Score '13 18 9 14 9      '$ PHQ2-9    Flowsheet Row Video Visit from 12/14/2021 in [facility] Video Visit from 09/24/2021 in [facility] from 06/26/2021 in [facility] Video Visit from 06/18/2021 in [facility] Video Visit from 01/20/2021 in [place]  PHQ-2 Total Score '1 6 2 1 3  '$ PHQ-9 Total Score '4 13 6 5 7      '$ Flowsheet Row Video Visit from 12/14/2021 in [facility] ED from 10/20/2021 in [place] Video Visit from 09/24/2021 in [place] Error: Question 6 not populated No Risk Error: Q7 should not be populated when Q6 is No       Assessment and Plan:    At this time I believe the patient is experiencing more of an adjustment disorder with an anxious mood state.  She has a diagnosis of major clinical depression and takes Effexor 225 mg which she will continue.  She has adjunct treatment with Abilify 5 mg and she also takes Lamictal 200 mg.  In the past she has been on Xanax but over the last year she was able to come off of it.  Today we are going to restart her on Ativan 0.5 mg each night with an extra 1 if she needs it on a as needed basis.  Anxiety is the biggest problem as well as poor sleep.  The biggest intervention will be to try to get her therapist.  Regarding refer her to a therapist named Altha Harm in our center.  The patient is not suicidal.  She actually functions very well.  With addition  of Ativan at night and hopefully she will sleep better and availability to take another Ativan will certainly be appropriate.  She will return to see me in 2 months.  Collaboration of Care:  Patient/Guardian was advised Release of Information  must be obtained prior to any record release in order to collaborate their care with an outside provider. Patient/Guardian was advised if they have not already done so to contact the registration department to sign all necessary forms in order for [place] to release information regarding their care.   Consent: Patient/Guardian gives verbal consent for treatment and assignment of benefits for services provided during this visit. Patient/Guardian expressed understanding and agreed to proceed.   Jerral Ralph, MD 6/28/20234:15 PM

## 2022-06-03 ENCOUNTER — Ambulatory Visit (HOSPITAL_COMMUNITY): Payer: Medicare HMO | Admitting: Licensed Clinical Social Worker

## 2022-06-28 ENCOUNTER — Ambulatory Visit (INDEPENDENT_AMBULATORY_CARE_PROVIDER_SITE_OTHER): Payer: Medicare HMO | Admitting: Licensed Clinical Social Worker

## 2022-06-28 ENCOUNTER — Encounter (HOSPITAL_COMMUNITY): Payer: Self-pay

## 2022-06-28 DIAGNOSIS — F324 Major depressive disorder, single episode, in partial remission: Secondary | ICD-10-CM

## 2022-06-28 DIAGNOSIS — F411 Generalized anxiety disorder: Secondary | ICD-10-CM

## 2022-06-28 NOTE — Progress Notes (Signed)
Comprehensive Clinical Assessment (CCA) Note  06/28/2022 [place] [id number]  Chief Complaint:  Chief Complaint  Patient presents with   Establish Care   Depression   Anxiety   Visit Diagnosis:    Encounter Diagnoses  Name Primary?   Major depressive disorder with single episode, in partial remission (HCC) Yes   Generalized anxiety disorder     CCA Screening, Triage and Referral (STR)  Patient Reported Information How did you hear about [place]? Other (Comment) (psychiatrist)  Referral name: Dr. Norma Fredrickson  Referral phone number: No data recorded  Whom do you see for routine medical problems? No data recorded Practice/Facility Name: No data recorded Practice/Facility Phone Number: No data recorded Name of Contact: No data recorded Contact Number: No data recorded Contact Fax Number: No data recorded Prescriber Name: No data recorded Prescriber Address (if known): No data recorded  What Is the Reason for Your Visit/Call Today? [place] is a 66 yo female reporting to [facility] for establishment of outpatient psychotherapy services. Patient reports that she is currently under the psychiatric care of  Dr. Norma Fredrickson and is currently taking effexor, Lamictal, and Abilify for management of depression and anxiety symptoms. Patient reports that she currently has received counseling in the past through [facility], but her therapist left the practice and patient has not received therapy in six months. Patient reports that she currently resides alone in [place], and has her daughter that resides with her at times when she is not in [place]. Patient denies any history of previous inpatient psychiatric hospitalizations. Patient reports that she tries very hard to stay active, and enjoys traveling, working with her flowers, and attending concerts. Patient reports that some of her biggest stressors include worrying about the health of her son, age 36, that has  previously had a stroke and a heart valve replacement. Patient reports that she is an active participant in her grand children's lives, and that they bring her a lot of joy. Patient reports that she has aging parents that recently transitioned into independent living facility and that her sister made the choice to be their primary caregiver 24/7. Patient reports that she went through a divorce seven years ago that was a very difficult time for her. Patient reports that some of our goals for therapy are maintaining levels of progress, utilizing and developing coping skills for managing depression and anxiety symptoms.  How Long Has This Been Causing You Problems? > than 6 months  What Do You Feel Would Help You the Most Today? Treatment for Depression or other mood problem   Have You Recently Been in Any Inpatient Treatment (Hospital/Detox/Crisis Center/28-Day Program)? No  Name/Location of Program/Hospital:No data recorded How Long Were You There? No data recorded When Were You Discharged? No data recorded  Have You Ever Received Services From [facility] Before? Yes  Who Do You See at [facility]? No data recorded  Have You Recently Had Any Thoughts About Hurting Yourself? No  Are You Planning to Commit Suicide/Harm Yourself At This time? No   Have you Recently Had Thoughts About [place]? No  Explanation: No data recorded  Have You Used Any Alcohol or Drugs in the Past 24 Hours? No  How Long Ago Did You Use Drugs or Alcohol? No data recorded What Did You Use and How Much? No data recorded  Do You Currently Have a Therapist/Psychiatrist? Yes  Name of Therapist/Psychiatrist: Dr. Norma Fredrickson   Have You Been Recently Discharged  From Any [facility] or Programs? No  Explanation of Discharge From Practice/Program: No data recorded    CCA Screening Triage Referral Assessment Type of Contact: Tele-Assessment  Is this Initial or Reassessment? Initial  Assessment  Date Telepsych consult ordered in [facility]:  No data recorded Time Telepsych consult ordered in [facility]:  No data recorded  Patient Reported Information Reviewed? No data recorded Patient Left Without Being Seen? No data recorded Reason for Not Completing Assessment: No data recorded  Collateral Involvement: EPIC review   Does Patient Have a Court Appointed Legal Guardian? No data recorded Name and Contact of Legal Guardian: No data recorded If Minor and Not Living with Parent(s), Who has Custody? No data recorded Is CPS involved or ever been involved? Never  Is APS involved or ever been involved? Never   Patient Determined To Be At Risk for Harm To Self or Others Based on Review of Patient Reported Information or Presenting Complaint? No  Method: No data recorded Availability of Means: No data recorded Intent: No data recorded Notification Required: No data recorded Additional Information for Danger to Others Potential: No data recorded Additional Comments for Danger to Others Potential: No data recorded Are There Guns or Other Weapons in Your Home? No data recorded Types of Guns/Weapons: No data recorded Are These Weapons Safely Secured?                            No data recorded Who Could Verify You Are Able To Have These Secured: No data recorded Do You Have any Outstanding Charges, Pending Court Dates, Parole/Probation? No data recorded Contacted To Inform of Risk of Harm To Self or Others: No data recorded  Location of Assessment: Other (comment)   Does Patient Present under Involuntary Commitment? No  IVC Papers Initial File Date: No data recorded  [place] of Residence: [place]   Patient Currently Receiving the Following Services: Medication Management   Determination of Need: Routine (7 days)   Options For Referral: Medication Management; Outpatient Therapy     CCA Biopsychosocial Intake/Chief Complaint:  establishment of outpatient psychotherapy  services  Current Symptoms/Problems: worrying, crying spells, feelings of worthlessness, feelings of guilt, occasional "panic" episodes when in crowds   Patient Reported Schizophrenia/Schizoaffective Diagnosis in Past: No   Strengths: good levels of self awareness--good set of coping skills  Preferences: outpatient psychiatric supports  Abilities: committment to therapy   Type of Services Patient Feels are Needed: medication management; psychotherapy   Initial Clinical Notes/Concerns: depression/anxiety/external stressors/caregiver stress   Mental Health Symptoms Depression:   Change in energy/activity; Tearfulness; Worthlessness; Hopelessness; Fatigue   Duration of Depressive symptoms:  Greater than two weeks   Mania:   None   Anxiety:    Worrying; Tension; Restlessness   Psychosis:   None   Duration of Psychotic symptoms: No data recorded  Trauma:   None   Obsessions:   Recurrent & persistent thoughts/impulses/images; Cause anxiety ("worries about my son")   Compulsions:   None   Inattention:   None   Hyperactivity/Impulsivity:   None   Oppositional/Defiant Behaviors:   None   Emotional Irregularity:  No data recorded  Other Mood/Personality Symptoms:   Intermittent feelings of dep/anx    Mental Status Exam Appearance and self-care  Stature:   Average   Weight:   Average weight   Clothing:   Neat/clean   Grooming:   Normal   Cosmetic use:   Age appropriate   Posture/gait:  Normal   Motor activity:   Not Remarkable   Sensorium  Attention:   Normal   Concentration:   Normal   Orientation:   X5   Recall/memory:   Normal   Affect and Mood  Affect:   Anxious; Depressed; Tearful   Mood:   Anxious; Depressed   Relating  Eye contact:   Normal   Facial expression:   Responsive   Attitude toward examiner:   Cooperative   Thought and Language  Speech flow:  Clear and Coherent   Thought content:    Appropriate to Mood and Circumstances   Preoccupation:   Guilt; Ruminations ("son's health")   Hallucinations:   None   Organization:  No data recorded  [employer] of Knowledge:   Good   Intelligence:   Above Average   Abstraction:   Normal   Judgement:   Good   Reality Testing:   Realistic   Insight:   Good   Decision Making:   Normal   Social Functioning  Social Maturity:   Responsible   Social Judgement:   Normal   Stress  Stressors:   Illness; Family conflict (worry about son's health, health of parents)   Coping Ability:   Overwhelmed   Skill Deficits:   None   Supports:   Friends/Service system; Family     Religion: Religion/Spirituality Are You A Religious Person?: Yes  Leisure/Recreation: Leisure / Recreation Do You Have Hobbies?: Yes Leisure and Hobbies: attending concerts, traveling, working with flowers, spending time with family/grandchildren  Exercise/Diet: Exercise/Diet Do You Exercise?: Yes How Many Times a Week Do You Exercise?: 6-7 times a week Have You Gained or Lost A Significant Amount of Weight in the Past Six Months?: No Do You Follow a Special Diet?: No Do You Have Any Trouble Sleeping?: No   CCA Employment/Education Employment/Work Situation: Employment / Work Copywriter, 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 Has Patient ever Been in the [employer]?: No  Education: Education Is Patient Currently Attending School?: No Did Teacher, adult education From [employer]?: Yes Did You Attend College?: Yes What Type of College Degree Do you Have?: bachelors special ed Did [place]?: No What Was Your Major?: Special Education Did You Have An Individualized Education Program (IIEP): No Did You Have Any Difficulty At School?: No Patient's Education Has Been Impacted by Current  Illness: No   CCA Family/Childhood History Family and Relationship History: Family history Marital status: Divorced Divorced, when?: 7 yrs ago What types of issues is patient dealing with in the relationship?: Pt reports that her husband has gotten remarried, and pt is finding that difficult to navigate Does patient have children?: Yes How many children?: 2 How is patient's relationship with their children?: one dtr in [place], and one son local   "A++" relationship with both  Childhood History:  Childhood History By whom was/is the patient raised?: Both parents Additional childhood history information: stable relationship w/ parents Description of patient's relationship with caregiver when they were a child: stable childhood relationship Patient's description of current relationship with people who raised him/her: Stable current relationship--parents both alive and recently transitioned into independent living facility. Pts sister is their full time caregiver Does patient have siblings?: Yes Number of Siblings: 2 Description of patient's current relationship with siblings: good relationships with siblings. Brother in [place] and sister caregiver to parenst Did  patient suffer any verbal/emotional/physical/sexual abuse as a child?: No Did patient suffer from severe childhood neglect?: No Has patient ever been sexually abused/assaulted/raped as an adolescent or adult?: No Was the patient ever a victim of a crime or a disaster?: No Witnessed domestic violence?: No Has patient been affected by domestic violence as an adult?: No  Child/Adolescent Assessment:     CCA Substance Use Alcohol/Drug Use: Alcohol / Drug Use Pain Medications: SEE MAR Prescriptions: SEE MAR Over the Counter: SEE MAR History of alcohol / drug use?: No history of alcohol / drug abuse Longest period of sobriety (when/how long): N/A Negative Consequences of Use:  (NONE) Withdrawal Symptoms: None      ASAM's:   Six Dimensions of Multidimensional Assessment  Dimension 1:  Acute Intoxication and/or Withdrawal Potential:   Dimension 1:  Description of individual's past and current experiences of substance use and withdrawal: NONE  Dimension 2:  Biomedical Conditions and Complications:   Dimension 2:  Description of patient's biomedical conditions and  complications: X  Dimension 3:  Emotional, Behavioral, or Cognitive Conditions and Complications:  Dimension 3:  Description of emotional, behavioral, or cognitive conditions and complications: X  Dimension 4:  Readiness to Change:  Dimension 4:  Description of Readiness to Change criteria: X  Dimension 5:  Relapse, Continued use, or Continued Problem Potential:  Dimension 5:  Relapse, continued use, or continued problem potential critiera description: X  Dimension 6:  Recovery/Living Environment:  Dimension 6:  Recovery/Iiving environment criteria description: X  ASAM Severity Score: ASAM's Severity Rating Score: 0  ASAM Recommended Level of Treatment: ASAM Recommended Level of Treatment: Level I Outpatient Treatment   Substance use Disorder (SUD) Substance Use Disorder (SUD)  Checklist Symptoms of Substance Use:  (NONE)  Recommendations for Services/Supports/Treatments: Recommendations for Services/Supports/Treatments Recommendations For Services/Supports/Treatments: Medication Management, Individual Therapy  DSM5 Diagnoses: Patient Active Problem List   Diagnosis Date Noted   Grief 01/20/2021   Generalized anxiety disorder 03/20/2020   Major depressive disorder with single episode, in partial remission ([place]) 03/20/2020   Smoker 02/02/2018   Personal history of colonic adenomas 08/27/2013    Patient Centered Plan: Patient is on the following Treatment Plan(s):  Anxiety and Depression   Referrals to Alternative Service(s): Referred to Alternative Service(s):   Place:   Date:   Time:    Referred to Alternative Service(s):   Place:   Date:    Time:    Referred to Alternative Service(s):   Place:   Date:   Time:    Referred to Alternative Service(s):   Place:   Date:   Time:      Collaboration of Care: Other pt encouraged to continue care with psychiatrist of record, Dr. Norma Fredrickson  Patient/Guardian was advised Release of Information must be obtained prior to any record release in order to collaborate their care with an outside provider. Patient/Guardian was advised if they have not already done so to contact the registration department to sign all necessary forms in order for [place] to release information regarding their care.   Consent: Patient/Guardian gives verbal consent for treatment and assignment of benefits for services provided during this visit. Patient/Guardian expressed understanding and agreed to proceed.   Ayelet Gruenewald R Anastasios Melander, LCSW

## 2022-06-28 NOTE — Plan of Care (Signed)
Developed tx plan based on pt self reported input. Pt verbally agrees with treatment plan at time of session  

## 2022-07-06 ENCOUNTER — Ambulatory Visit (HOSPITAL_BASED_OUTPATIENT_CLINIC_OR_DEPARTMENT_OTHER): Payer: Medicare HMO | Admitting: Psychiatry

## 2022-07-06 DIAGNOSIS — F325 Major depressive disorder, single episode, in full remission: Secondary | ICD-10-CM | POA: Diagnosis not present

## 2022-07-06 DIAGNOSIS — F32A Depression, unspecified: Secondary | ICD-10-CM | POA: Diagnosis not present

## 2022-07-06 DIAGNOSIS — F411 Generalized anxiety disorder: Secondary | ICD-10-CM | POA: Diagnosis not present

## 2022-07-06 MED ORDER — LORAZEPAM 0.5 MG PO TABS: ORAL_TABLET | ORAL | 3 refills | Status: DC

## 2022-07-06 MED ORDER — VENLAFAXINE HCL ER 75 MG PO CP24: 225.0000 mg | ORAL_CAPSULE | Freq: Every day | ORAL | 3 refills | Status: DC

## 2022-07-06 MED ORDER — ARIPIPRAZOLE 5 MG PO TABS: ORAL_TABLET | ORAL | 4 refills | Status: DC

## 2022-07-06 MED ORDER — ARIPIPRAZOLE 2 MG PO TABS: ORAL_TABLET | ORAL | 3 refills | Status: DC

## 2022-07-06 NOTE — Progress Notes (Signed)
Psychiatric Initial Adult Assessment   Patient Identification: [place] MRN:  [id number] Date of Evaluation:  07/06/2022 Referral Source: Ronne Binning Chief Complaint: Depression             Today the patient is seen in the office.  Unfortunately she went to the wrong office so she was about 20 minutes late.  But generally actually she is doing better.  She described herself as doing great.  She denies daily depression.  Her anxiety is relatively low.  Her stress with the parents is somewhat lessened.  Her parents is new to an independent living setting in [place].  Her conflict with her sister is somewhat less.  Her sister who is younger goes over and spends a lot of time with the parents.  The other stress is the fact that the patient's son had an MI and a CVA.  He is doing fairly well at this time.  They have 2 children the patient spends a lot of time with these 2 children.  They are there for her grandchildren ages 11 and 2.  The patient enjoys the time with them.  She described himself as a person who loves kids.  The patient does have some financial stresses.  She is divorced.  Recently she has been diagnosed with hypertension and hypercholesterolemia.  Noted again the patient has little evidence of a psychotic process.  I believe she was given the Abilify as adjunct treatment with the Effexor.  On her last visit we gave her a small dose of Ativan to be taken at night for sleep.  She rarely takes it.  Patient is functioning quite well.  On her last visit we found little historical evidence of mania. Visit Diagnosis:    ICD-10-CM   1. Mild depression  F32.A venlafaxine XR (EFFEXOR-XR) 75 MG 24 hr capsule    ARIPiprazole (ABILIFY) 2 MG tablet    DISCONTINUED: ARIPiprazole (ABILIFY) 5 MG tablet    2. Generalized anxiety disorder  F41.1 venlafaxine XR (EFFEXOR-XR) 75 MG 24 hr capsule      History of Present Illness: See above  Associated Signs/Symptoms: Depression Symptoms:   depressed mood, (Hypo) Manic Symptoms:   Anxiety Symptoms:   Psychotic Symptoms:   PTSD Symptoms: NA  Past Psychiatric History: Multiple psychotropic medicines and psychotherapy  Previous Psychotropic Medications: Yes   Substance Abuse History in the last 12 months:  No.  Consequences of Substance Abuse: NA  Past Medical History:  Past Medical History:  Diagnosis Date   Abnormal Pap smear of cervix 09/17/2015   LSIL CIN1 HR HPV:+detected   Allergy    Anxiety    Depression    Fibroids 05/2000   Personal history of colonic adenomas 08/27/2013   Smoker     Past Surgical History:  Procedure Laterality Date   CESAREAN SECTION     x 2   CHOLECYSTECTOMY, LAPAROSCOPIC  1989   COLONOSCOPY     FINGER SURGERY Right 2007   pointer, severed tendons   POLYPECTOMY      Family Psychiatric History:   Family History:  Family History  Problem Relation Age of Onset   Colon polyps Father    Colon polyps Brother    Heart disease Brother    Heart disease Mother    Colon cancer Neg Hx    Esophageal cancer Neg Hx    Rectal cancer Neg Hx    Stomach cancer Neg Hx     Social History:   Social History  Socioeconomic History   Marital status: Divorced    Spouse name: Not on file   Number of children: 2   Years of education: Not on file   Highest education level: Not on file  Occupational History   Not on file  Tobacco Use   Smoking status: Every Day    Packs/day: 1.00    Years: 30.00    Total pack years: 30.00    Types: Cigarettes   Smokeless tobacco: Never  Vaping Use   Vaping Use: Never used  Substance and Sexual Activity   Alcohol use: Yes    Alcohol/week: 1.0 standard drink of alcohol    Types: 1 Glasses of wine per week    Comment: occasionally   Drug use: No   Sexual activity: Not Currently    Partners: Male    Birth control/protection: Post-menopausal  Other Topics Concern   Not on file  Social History Narrative   Not on file   Social Determinants of  Health   Financial Resource Strain: Not on file  Food Insecurity: Not on file  Transportation Needs: Not on file  Physical Activity: Not on file  Stress: Not on file  Social Connections: Not on file    Additional Social History:   Allergies:   Allergies  Allergen Reactions   Biaxin [Clarithromycin] Hives   Ceftin [Cefuroxime Axetil]    Vancomycin     Metabolic Disorder Labs: Lab Results  Component Value Date   HGBA1C 5.4 02/05/2020   MPG 103 11/12/2016   No results found for: "PROLACTIN" Lab Results  Component Value Date   CHOL 187 02/05/2020   TRIG 145 02/05/2020   HDL 52 02/05/2020   CHOLHDL 3.6 02/05/2020   VLDL 27 11/12/2016   LDLCALC 109 (H) 02/05/2020   LDLCALC 162 (H) 11/12/2016   Lab Results  Component Value Date   TSH 3.650 02/05/2020    Therapeutic Level Labs: No results found for: "LITHIUM" No results found for: "CBMZ" No results found for: "VALPROATE"  Current Medications: Current Outpatient Medications  Medication Sig Dispense Refill   ARIPiprazole (ABILIFY) 2 MG tablet 1 qam 30 tablet 3   Desloratadine-Pseudoephedrine (CLARINEX-D 12 HOUR PO) Take 1 tablet by mouth 2 (two) times daily.     lamoTRIgine (LAMICTAL) 200 MG tablet Take 1 tablet (200 mg total) by mouth daily. 30 tablet 3   LORazepam (ATIVAN) 0.5 MG tablet 1 qhs  1 qday PRN 45 tablet 3   venlafaxine XR (EFFEXOR-XR) 75 MG 24 hr capsule Take 3 capsules (225 mg total) by mouth daily with breakfast. 90 capsule 3   Current Facility-Administered Medications  Medication Dose Route Frequency Provider Last Rate Last Admin   0.9 %  sodium chloride infusion  500 mL Intravenous Once Gatha Mayer, MD        Musculoskeletal: Strength & Muscle Tone: within normal limits Gait & Station: normal Patient leans: N/A  Psychiatric Specialty Exam: Review of Systems  Last menstrual period 12/17/2007.There is no height or weight on file to calculate BMI.  General Appearance: Casual  Eye Contact:   Good  Speech:  Clear and CoherentNormal  Volume:    Mood:  NA  Affect:  NA  Thought Process:  Goal Directed  Orientation:  NA  Thought Content:  WDL  Suicidal Thoughts:  No  Homicidal Thoughts:  No  Memory:  NA  Judgement:  Good  Insight:  Fair  Psychomotor Activity:  Normal  Concentration:    Recall:  [place]  of Knowledge:Good  Language: Good  Akathisia:  No  Handed:  Right  AIMS (if indicated):  not done  Assets:  Desire for Improvement  ADL's:  Intact  Cognition: WNL  Sleep:  Good   Screenings: GAD-7    Flowsheet Row Video Visit from 12/14/2021 in [facility] Video Visit from 09/24/2021 in [facility] Video Visit from 06/18/2021 in [facility] Video Visit from 01/20/2021 in [facility] Video Visit from 10/21/2020 in [facility]  Total GAD-7 Score '13 18 9 14 9      '$ PHQ2-9    Flowsheet Row Counselor from 06/28/2022 in [place] Video Visit from 12/14/2021 in [facility] Video Visit from 09/24/2021 in [facility] Counselor from 06/26/2021 in [facility] Video Visit from 06/18/2021 in [place]  PHQ-2 Total Score '1 1 6 2 1  '$ PHQ-9 Total Score '8 4 13 6 5      '$ Flowsheet Row Counselor from 06/28/2022 in [place] Video Visit from 12/14/2021 in [facility] ED from 10/20/2021 in [place] No Risk Error: Question 6 not populated No Risk       Assessment and Plan:     This patient's most likely diagnosis is a history of major depression.  At this time she shows little to no evidence of major depression that is active.  Today we will ask her to continue her Effexor at 225 mg.   Today we will ask her to reduce her Abilify from 5 mg down to 2 mg.  She will continue taking Lamictal.  She will continue taking Ativan on a as needed basis.  The good news that she has started in therapy with Margreta Journey and seems to like her.  Overall the patient has few vegetative symptoms.  She is actually functioning fairly well.  She will return to see me in 2-1/2 months.  Collaboration of Care:  Patient/Guardian was advised Release of Information must be obtained prior to any record release in order to collaborate their care with an outside provider. Patient/Guardian was advised if they have not already done so to contact the registration department to sign all necessary forms in order for [place] to release information regarding their care.   Consent: Patient/Guardian gives verbal consent for treatment and assignment of benefits for services provided during this visit. Patient/Guardian expressed understanding and agreed to proceed.   Jerral Ralph, MD 9/19/20234:28 PM

## 2022-08-03 ENCOUNTER — Ambulatory Visit (INDEPENDENT_AMBULATORY_CARE_PROVIDER_SITE_OTHER): Payer: Medicare HMO | Admitting: Licensed Clinical Social Worker

## 2022-08-03 DIAGNOSIS — F32A Depression, unspecified: Secondary | ICD-10-CM | POA: Diagnosis not present

## 2022-08-03 NOTE — Plan of Care (Signed)
  Problem: Depression Goal:  Decrease depressive symptoms and improve levels of effective functioning-pt reports a decrease in overall depression symptoms 3 out of 5 sessions documented.  Outcome: Progressing Goal: Develop healthy thinking patterns and beliefs about self, others, and the world that lead to the alleviation and help prevent the relapse of depression per self report 3 out of 5 sessions documented.   Outcome: Progressing Intervention: REVIEW PLEASE SKILLS (TREAT PHYSICAL ILLNESS, BALANCE EATING, AVOID MOOD-ALTERING SUBSTANCES, BALANCE SLEEP AND GET EXERCISE) WITH Cassandra Lynch Note: Reviewed  Intervention: Encourage verbalization of feelings/concerns/expectations Note: Allowed pt to explore/express   Problem: Anxiety  Goal: Reduce overall frequency, intensity, and duration of the anxiety so that daily functioning is not impaired per pt self report 3 out of 5 sessions documented.   Outcome: Progressing Goal: Learn and implement coping skills that result in a reduction of anxiety and worry, and improve daily functioning per pt report 3 out of 5 sessions documented  Outcome: Progressing Intervention: Assist with relaxation techniques, as appropriate (deep breathing exercises, meditation, guided imagery) Note: Reviewed  Intervention: Educate patient on: Stress management Note: Reviewed

## 2022-08-03 NOTE — Progress Notes (Signed)
Virtual Visit via Video Note  I connected with Cassandra Lynch on 08/03/22 at  8:00 AM EDT by a video enabled telemedicine application and verified that I am speaking with the correct person using two identifiers.  Location: Patient: home Provider: remote office [place], [name])   I discussed the limitations of evaluation and management by telemedicine and the availability of in person appointments. The patient expressed understanding and agreed to proceed.   I discussed the assessment and treatment plan with the patient. The patient was provided an opportunity to ask questions and all were answered. The patient agreed with the plan and demonstrated an understanding of the instructions.   The patient was advised to call back or seek an in-person evaluation if the symptoms worsen or if the condition fails to improve as anticipated.  I provided 40 minutes of non-face-to-face time during this encounter.   [place], LCSW   THERAPIST PROGRESS NOTE  Session Time: [phone]  Participation Level: Active  Behavioral Response: Neat and Well GroomedAlertDepressed  Type of Therapy: Individual Therapy  Treatment Goals addressed: Problem: Depression Goal:  Decrease depressive symptoms and improve levels of effective functioning-pt reports a decrease in overall depression symptoms 3 out of 5 sessions documented.  Outcome: Progressing Goal: Develop healthy thinking patterns and beliefs about self, others, and the world that lead to the alleviation and help prevent the relapse of depression per self report 3 out of 5 sessions documented.   Outcome: Progressing Intervention: REVIEW PLEASE SKILLS (TREAT PHYSICAL ILLNESS, BALANCE EATING, AVOID MOOD-ALTERING SUBSTANCES, BALANCE SLEEP AND GET EXERCISE) WITH [place] Note: Reviewed  Intervention: Encourage verbalization of feelings/concerns/expectations Note: Allowed pt to explore/express   Problem: Anxiety  Goal: Reduce overall frequency,  intensity, and duration of the anxiety so that daily functioning is not impaired per pt self report 3 out of 5 sessions documented.   Outcome: Progressing Goal: Learn and implement coping skills that result in a reduction of anxiety and worry, and improve daily functioning per pt report 3 out of 5 sessions documented  Outcome: Progressing Intervention: Assist with relaxation techniques, as appropriate (deep breathing exercises, meditation, guided imagery) Note: Reviewed  Intervention: Educate patient on: Stress management Note: Reviewed     ProgressTowards Goals: Progressing  Interventions: CBT, Supportive, and Family Systems  Summary: Cassandra Lynch is a 66 y.o. female who presents with continuing symptoms related to depression diagnosis. Pt reports that she has been compliant on her medications and is having limited side effects. Pt reports that she is getting good quality and quantity of sleep--pt reports that its a bit harder waking/getting up in the mornings. Pt reports fewer crying episodes than at previous session and feels a decrease in overall depression symptoms.   Allowed pt to explore and express thoughts and feelings associated with recent life situations and external stressors.Pt repots that her sister is continuing to control her parent's care, and pt is not happy about this. Allowed pt to explore her thoughts and feelings about this--pt reports that she is upset that this is happening because she was very close with her sister in the past. Discussed cognitive distortions--"should" statements as it relates to her sister's role within the family dynamic.   Discussed weekend trip that pt took with a friend--conference resort in [place]. Pt enjoyed the weekend and reports that it was a refreshing get-away. Pt states that travel is a big part of her overall self care and pt always feels better when she has the opportunity to go  and explore a new location.   Allowed pt to identify  stress triggers and coping skills that she uses in the moment to manage. Provided pt with additional emotion regulation strategies. Pt reflects understanding and cooperation   Continued recommendations are as follows: self care behaviors, positive social engagements, focusing on overall work/home/life balance, and focusing on positive physical and emotional wellness.   Suicidal/Homicidal: No  Therapist Response: Pt is continuing to apply interventions learned in session into daily life situations. Pt is currently on track to meet goals utilizing interventions mentioned above. Personal growth and progress noted. Treatment to continue as indicated.   Pt shows enhanced capacity for managing depression and anxiety symptoms.   Plan: Return again in 4 weeks.  Diagnosis:  Encounter Diagnosis  Name Primary?   Mild depression Yes    Collaboration of Care: Other pt to continue care with psychiatrist of record, Dr. Norma Fredrickson.  Patient/Guardian was advised Release of Information must be obtained prior to any record release in order to collaborate their care with an outside provider. Patient/Guardian was advised if they have not already done so to contact the registration department to sign all necessary forms in order for [place] to release information regarding their care.   Consent: Patient/Guardian gives verbal consent for treatment and assignment of benefits for services provided during this visit. Patient/Guardian expressed understanding and agreed to proceed.   [place], LCSW 08/03/2022

## 2022-08-09 IMAGING — MG DIGITAL SCREENING BILAT W/ TOMO W/ CAD
8 series · 8 of 24 positions shown · non-contrast
Comparison: Previous exam(s).

CLINICAL DATA: Screening.

EXAM:
DIGITAL SCREENING BILATERAL MAMMOGRAM WITH TOMO AND CAD

[R CC synth-2D]
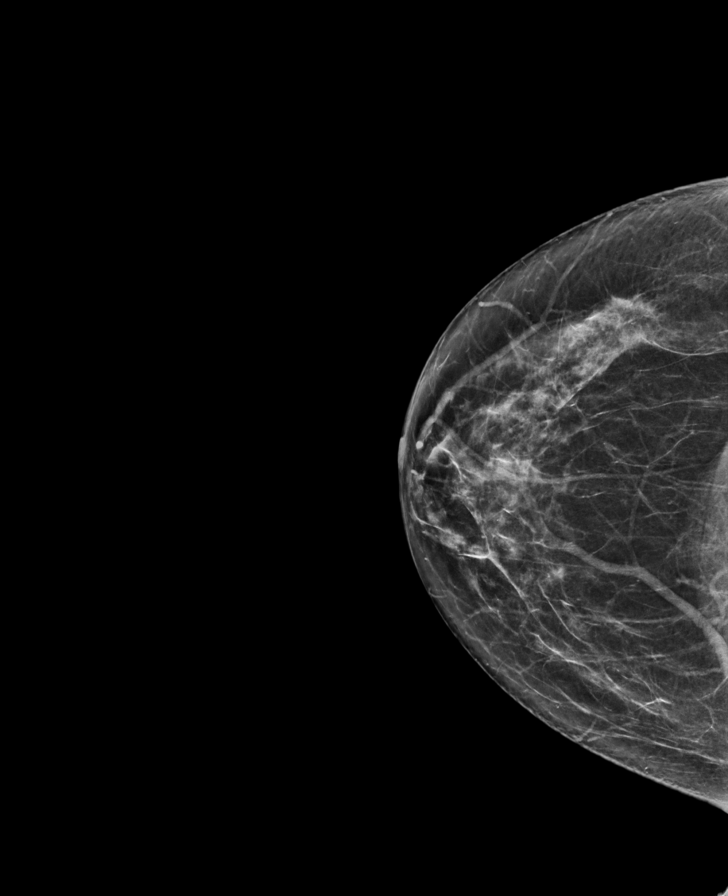

[R MLO synth-2D]
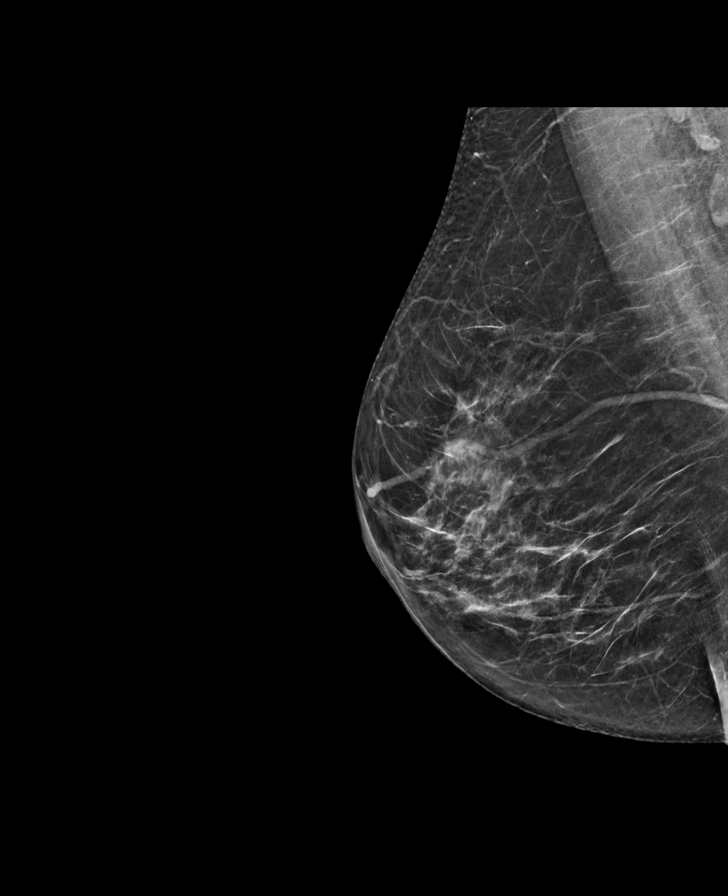

[L CC synth-2D]
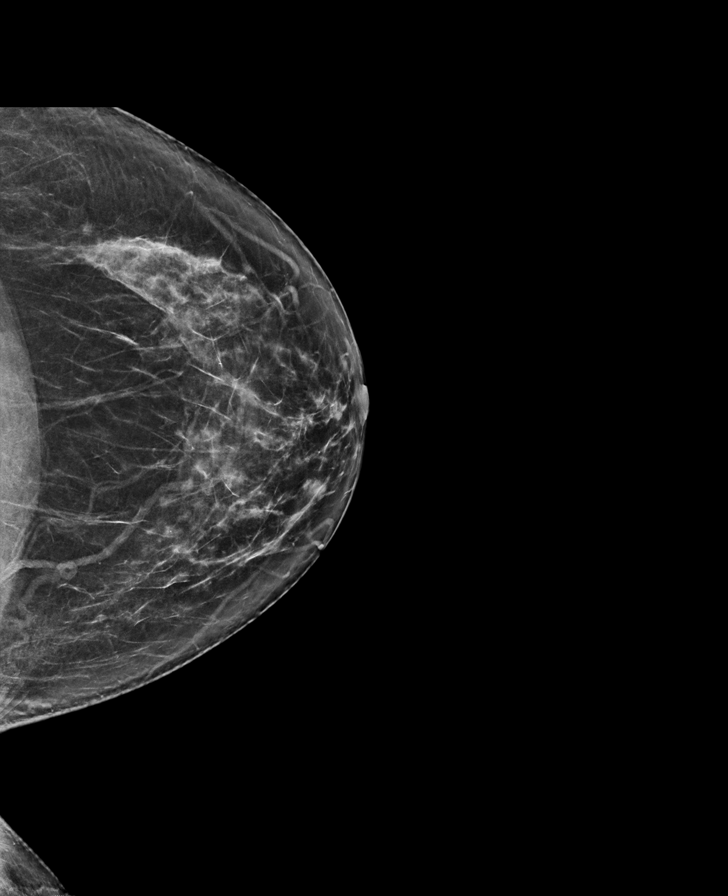

[L MLO synth-2D]
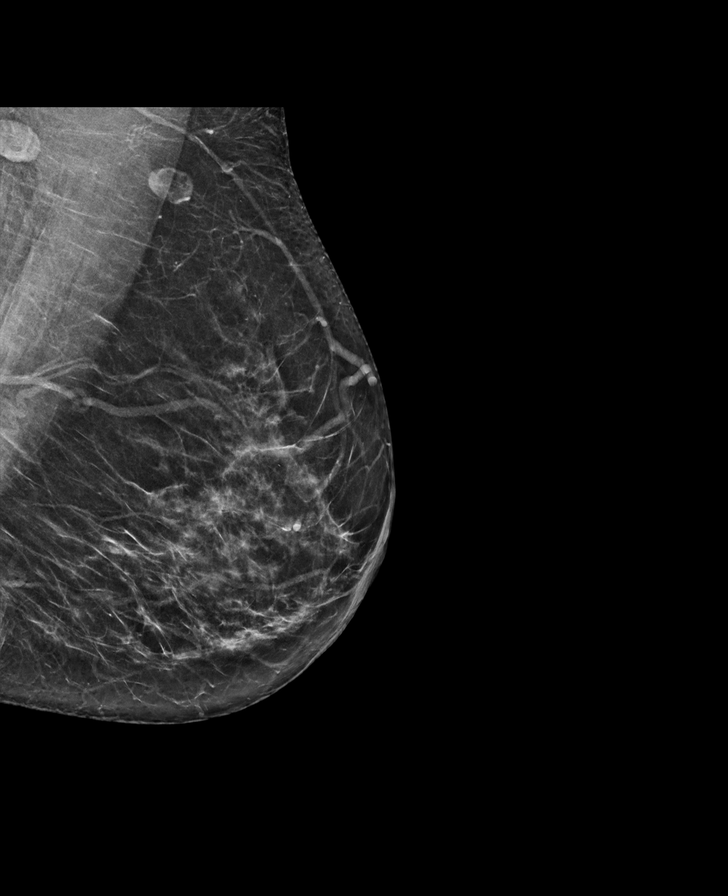

[R CC tomo · tomo slice 33/65.0]
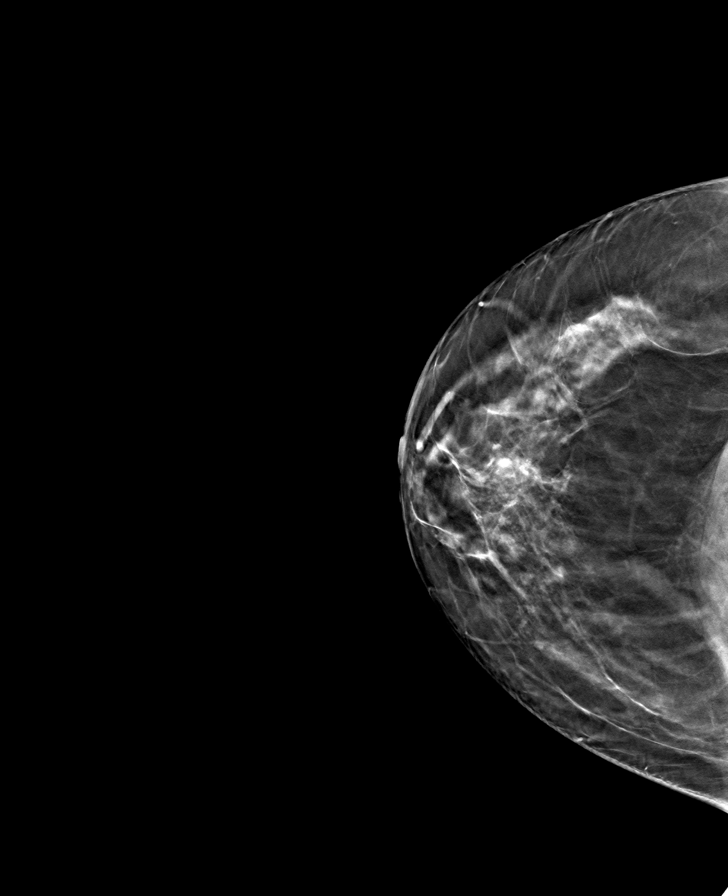

[L MLO tomo · tomo slice 33/66.0]
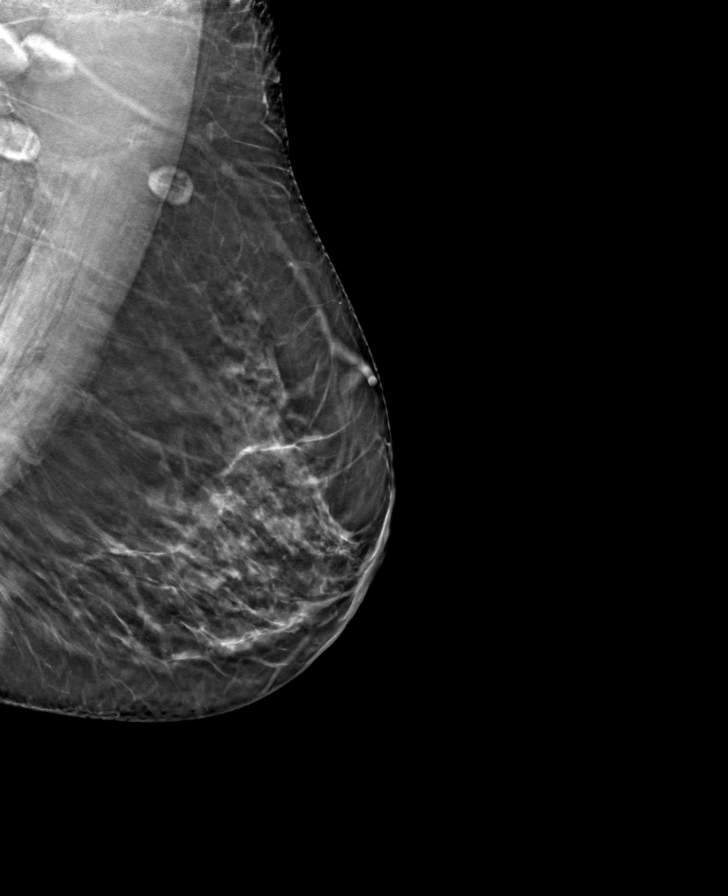

[R MLO tomo · tomo slice 33/64.0]
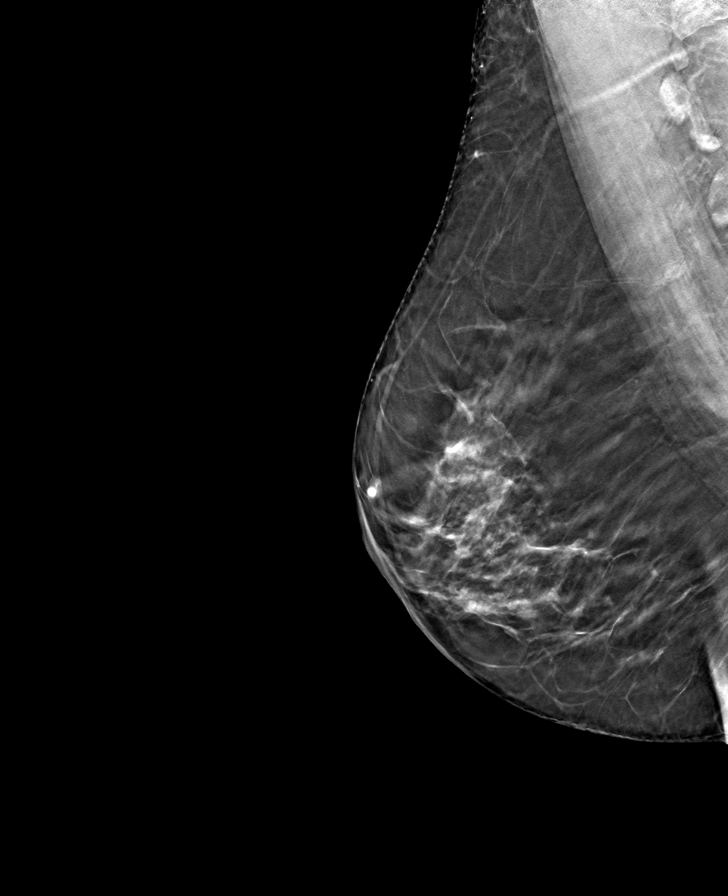

[L CC tomo · tomo slice 34/67.0]
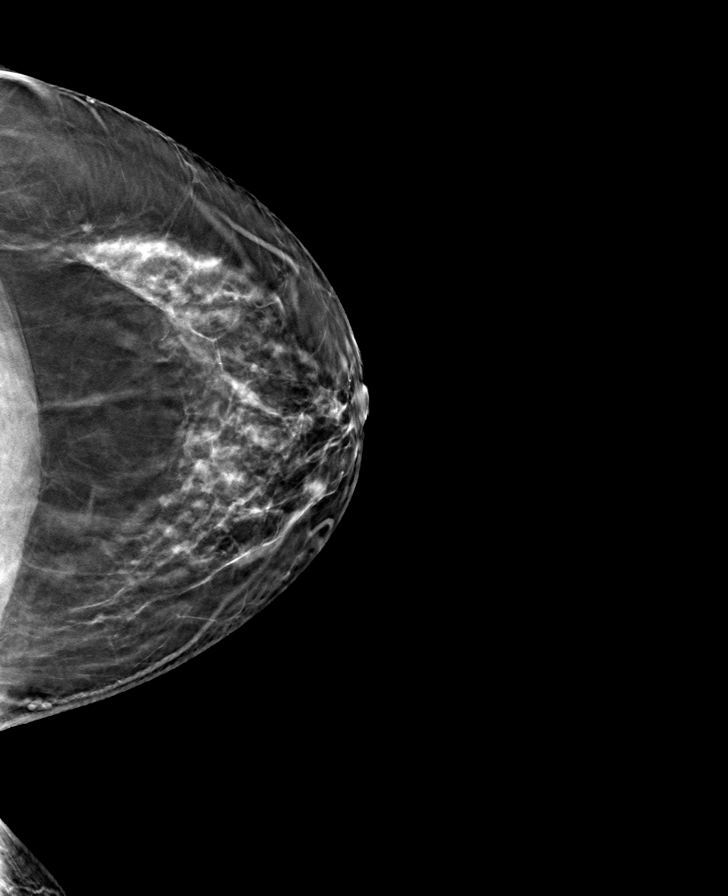

[8 of 24 positions shown; findings below may reference images not displayed]

ACR Breast Density Category c: The breast tissue is heterogeneously
dense, which may obscure small masses.
FINDINGS: There are no findings suspicious for malignancy. Images were
processed with CAD.
IMPRESSION: No mammographic evidence of malignancy. A result letter of this
screening mammogram will be mailed directly to the patient.

RECOMMENDATION:
Screening mammogram in one year. (Code:[id number])

BI-RADS CATEGORY  1: Negative.

## 2022-09-16 ENCOUNTER — Ambulatory Visit (INDEPENDENT_AMBULATORY_CARE_PROVIDER_SITE_OTHER): Payer: Medicare HMO | Admitting: Licensed Clinical Social Worker

## 2022-09-16 DIAGNOSIS — F32A Depression, unspecified: Secondary | ICD-10-CM

## 2022-09-16 DIAGNOSIS — F411 Generalized anxiety disorder: Secondary | ICD-10-CM | POA: Diagnosis not present

## 2022-09-16 NOTE — Progress Notes (Unsigned)
Virtual Visit via Video Note  I connected with Cassandra Lynch on 09/17/22 at  1:00 PM EST by a video enabled telemedicine application and verified that I am speaking with the correct person using two identifiers.  Location: Patient: home Provider: remote office [place], [name])   I discussed the limitations of evaluation and management by telemedicine and the availability of in person appointments. The patient expressed understanding and agreed to proceed.   I discussed the assessment and treatment plan with the patient. The patient was provided an opportunity to ask questions and all were answered. The patient agreed with the plan and demonstrated an understanding of the instructions.   The patient was advised to call back or seek an in-person evaluation if the symptoms worsen or if the condition fails to improve as anticipated.  I provided 40 minutes of non-face-to-face time during this encounter.   [place], LCSW   THERAPIST PROGRESS NOTE  Session Time: [phone]  Participation Level: Active  Behavioral Response: Neat and Well GroomedAlertDepressed  Type of Therapy: Individual Therapy  Treatment Goals addressed: Problem: Depression Goal:  Decrease depressive symptoms and improve levels of effective functioning-pt reports a decrease in overall depression symptoms 3 out of 5 sessions documented.  Outcome: Progressing Goal: Develop healthy thinking patterns and beliefs about self, others, and the world that lead to the alleviation and help prevent the relapse of depression per self report 3 out of 5 sessions documented.   Outcome: Progressing Intervention: REVIEW PLEASE SKILLS (TREAT PHYSICAL ILLNESS, BALANCE EATING, AVOID MOOD-ALTERING SUBSTANCES, BALANCE SLEEP AND GET EXERCISE) WITH Cassandra Lynch Note: Reviewed  Intervention: Encourage verbalization of feelings/concerns/expectations Note: Allowed pt to explore/express   Problem: Anxiety  Goal: Reduce overall frequency,  intensity, and duration of the anxiety so that daily functioning is not impaired per pt self report 3 out of 5 sessions documented.   Outcome: Progressing Goal: Learn and implement coping skills that result in a reduction of anxiety and worry, and improve daily functioning per pt report 3 out of 5 sessions documented  Outcome: Progressing Intervention: Assist with relaxation techniques, as appropriate (deep breathing exercises, meditation, guided imagery) Note: Reviewed  Intervention: Educate patient on: Stress management Note: Reviewed     ProgressTowards Goals: Progressing  Interventions: CBT, Supportive, and Family Systems  Summary: Cassandra Lynch is a 66 y.o. female who presents with continuing symptoms related to depression diagnosis. Patient reports that she is continuing to experience stress associated with her sister, and how her sister is continuing to maintain control over her parents, and everything that has to do with the parents. Patient reports that she had an verbal altercation with her sister two weeks ago, and is not spoken to her since then. Patient reports that this impacts access and directly impacts her relationship with her parents, because the sister is the one who schedules everything for them and does everything for them. Patient reports that her sister is very volatile, and when patient and brother discuss things, they feel that they need to give their sister some space. Discussed importance of giving sister space, but also maintaining a positive relationship with parents. Patient reports that it has gotten to the point where her sister starts screaming in the background every time they call just to have a conversation with their parents. Patient does report that there is no abuse or neglect going on in the home.  Reviewed importance of setting boundaries and allowed patient to identify different ways that she could set boundaries with her  sister to assist in managing this  family conflict. Patient reports that her brother is supportive, and will also set boundaries.  Patient reports that this time of year is very triggering for her because it reminds her of where she was one year ago--her son had a problem with his aorta and needed open heart surgery. 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  Patient reports that currently she is having some issues with her neighbor--the neighbor was feeding squirrels with peanuts, and patient feels that the peanuts were attracting rats. Patient is upset because she made the decision to use an air horn and blast it very loud every time she saw a squirrel, and her neighbor turned her in for disturbing the peace. Patient is upset with this, and is not speaking to the neighbor. Patient reports that this neighbor used to be her best friend. Explored that relationship timeline.   Pt reports that she enjoys going to concerts, and has tickets in march to see [place]. Pt stated that her sister is very critical of pts choice of self care activities.   Reviewed other self-care activities, mindfulness activities, and positive social engagement activities that patient can engage in in the next few weeks.   Allowed pt to identify stress triggers and coping skills that she uses in the moment to manage. Provided pt with additional emotion regulation strategies. Pt reflects understanding and cooperation   Continued recommendations are as follows: self care behaviors, positive social engagements, focusing on overall work/home/life balance, and focusing on positive physical and emotional wellness.   Suicidal/Homicidal: No  Therapist Response: Pt is continuing to apply interventions learned in session into daily life situations. Pt is currently on track to meet goals utilizing interventions mentioned above. Personal growth and progress noted. Treatment to  continue as indicated.   Pt shows enhanced capacity for managing depression and anxiety symptoms.   Plan: Return again in 4 weeks.  Diagnosis:  Encounter Diagnoses  Name Primary?   Mild depression Yes   Generalized anxiety disorder     Collaboration of Care: Other pt to continue care with psychiatrist of record, Dr. Norma Fredrickson.  Patient/Guardian was advised Release of Information must be obtained prior to any record release in order to collaborate their care with an outside provider. Patient/Guardian was advised if they have not already done so to contact the registration department to sign all necessary forms in order for [place] to release information regarding their care.   Consent: Patient/Guardian gives verbal consent for treatment and assignment of benefits for services provided during this visit. Patient/Guardian expressed understanding and agreed to proceed.   [place], LCSW 09/17/2022

## 2022-09-18 NOTE — Plan of Care (Signed)
  Problem: Depression Goal:  Decrease depressive symptoms and improve levels of effective functioning-pt reports a decrease in overall depression symptoms 3 out of 5 sessions documented.  Outcome: Progressing Goal: Develop healthy thinking patterns and beliefs about self, others, and the world that lead to the alleviation and help prevent the relapse of depression per self report 3 out of 5 sessions documented.   Outcome: Progressing Intervention: Encourage verbalization of feelings/concerns/expectations Note: Allowed pt to explore/express Intervention: Encourage self-care activities Note: Continued to encourage   Problem: Anxiety  Goal: Reduce overall frequency, intensity, and duration of the anxiety so that daily functioning is not impaired per pt self report 3 out of 5 sessions documented.   Outcome: Not Progressing Note: Pt continuing to experience anxiety related to family relationships Goal: Learn and implement coping skills that result in a reduction of anxiety and worry, and improve daily functioning per pt report 3 out of 5 sessions documented  Outcome: Progressing Intervention: Assist with relaxation techniques, as appropriate (deep breathing exercises, meditation, guided imagery) Note: Reviewed  Intervention: Encourage patient to identify triggers Note: Assisted w/ identification

## 2022-09-29 ENCOUNTER — Encounter (HOSPITAL_COMMUNITY): Payer: Self-pay | Admitting: Psychiatry

## 2022-09-29 ENCOUNTER — Ambulatory Visit (HOSPITAL_BASED_OUTPATIENT_CLINIC_OR_DEPARTMENT_OTHER): Payer: Medicare HMO | Admitting: Psychiatry

## 2022-09-29 VITALS — BP 163/91 | HR 78 | Ht 64.0 in | Wt 153.0 lb

## 2022-09-29 DIAGNOSIS — F325 Major depressive disorder, single episode, in full remission: Secondary | ICD-10-CM | POA: Diagnosis not present

## 2022-09-29 DIAGNOSIS — F32A Depression, unspecified: Secondary | ICD-10-CM

## 2022-09-29 DIAGNOSIS — F411 Generalized anxiety disorder: Secondary | ICD-10-CM | POA: Diagnosis not present

## 2022-09-29 MED ORDER — LAMOTRIGINE 200 MG PO TABS: 200.0000 mg | ORAL_TABLET | Freq: Every day | ORAL | 3 refills | Status: DC

## 2022-09-29 MED ORDER — ARIPIPRAZOLE 2 MG PO TABS: ORAL_TABLET | ORAL | 3 refills | Status: DC

## 2022-09-29 MED ORDER — VENLAFAXINE HCL ER 75 MG PO CP24: 225.0000 mg | ORAL_CAPSULE | Freq: Every day | ORAL | 3 refills | Status: DC

## 2022-09-29 NOTE — Progress Notes (Signed)
Psychiatric Initial Adult Assessment   Patient Identification: Cassandra Lynch MRN:  [id number] Date of Evaluation:  09/29/2022 Referral Source: Ronne Binning Chief Complaint: Depression         Today the patient is seen in the office.  I believe she is actually doing fairly well.  It is hard to exact understanding what the conflict is other than her sister seems to be overly involved with her parents and seems to be taking care of them excessively.  The bottom line is parents at this point probably need to move from independent living to assisted living.  I think the patient is okay with this.  The patient's son is doing well after having a cardiovascular event.  The patient takes care of her grandchildren ages 56 and 69 and enjoys her time.  The patient denies daily depression.  She is sleeping and eating well.  She has got good energy.  She continues in therapy.  She has financial stresses but overall patient seems to be functioning very well.  She reduced her Abilify from 5-2 with no consequences.  Today she is asking not to change anything until after the holidays and until she returns to see me. Visit Diagnosis:    ICD-10-CM   1. Mild depression  F32.A ARIPiprazole (ABILIFY) 2 MG tablet    lamoTRIgine (LAMICTAL) 200 MG tablet    venlafaxine XR (EFFEXOR-XR) 75 MG 24 hr capsule    2. Generalized anxiety disorder  F41.1 venlafaxine XR (EFFEXOR-XR) 75 MG 24 hr capsule      History of Present Illness: See above  Associated Signs/Symptoms: Depression Symptoms:  depressed mood, (Hypo) Manic Symptoms:   Anxiety Symptoms:   Psychotic Symptoms:   PTSD Symptoms: NA  Past Psychiatric History: Multiple psychotropic medicines and psychotherapy  Previous Psychotropic Medications: Yes   Substance Abuse History in the last 12 months:  No.  Consequences of Substance Abuse: NA  Past Medical History:  Past Medical History:  Diagnosis Date   Abnormal Pap smear of cervix 09/17/2015   LSIL CIN1  HR HPV:+detected   Allergy    Anxiety    Depression    Fibroids 05/2000   Personal history of colonic adenomas 08/27/2013   Smoker     Past Surgical History:  Procedure Laterality Date   CESAREAN SECTION     x 2   CHOLECYSTECTOMY, LAPAROSCOPIC  1989   COLONOSCOPY     FINGER SURGERY Right 2007   pointer, severed tendons   POLYPECTOMY      Family Psychiatric History:   Family History:  Family History  Problem Relation Age of Onset   Colon polyps Father    Colon polyps Brother    Heart disease Brother    Heart disease Mother    Colon cancer Neg Hx    Esophageal cancer Neg Hx    Rectal cancer Neg Hx    Stomach cancer Neg Hx     Social History:   Social History   Socioeconomic History   Marital status: Divorced    Spouse name: Not on file   Number of children: 2   Years of education: Not on file   Highest education level: Not on file  Occupational History   Not on file  Tobacco Use   Smoking status: Every Day    Packs/day: 1.00    Years: 30.00    Total pack years: 30.00    Types: Cigarettes   Smokeless tobacco: Never  Vaping Use   Vaping Use: Never used  Substance and Sexual Activity   Alcohol use: Yes    Alcohol/week: 1.0 standard drink of alcohol    Types: 1 Glasses of wine per week    Comment: occasionally   Drug use: No   Sexual activity: Not Currently    Partners: Male    Birth control/protection: Post-menopausal  Other Topics Concern   Not on file  Social History Narrative   Not on file   Social Determinants of Health   Financial Resource Strain: Not on file  Food Insecurity: Not on file  Transportation Needs: Not on file  Physical Activity: Not on file  Stress: Not on file  Social Connections: Not on file    Additional Social History:   Allergies:   Allergies  Allergen Reactions   Biaxin [Clarithromycin] Hives   Ceftin [Cefuroxime Axetil]    Vancomycin     Metabolic Disorder Labs: Lab Results  Component Value Date   HGBA1C  5.4 02/05/2020   MPG 103 11/12/2016   No results found for: "PROLACTIN" Lab Results  Component Value Date   CHOL 187 02/05/2020   TRIG 145 02/05/2020   HDL 52 02/05/2020   CHOLHDL 3.6 02/05/2020   VLDL 27 11/12/2016   LDLCALC 109 (H) 02/05/2020   LDLCALC 162 (H) 11/12/2016   Lab Results  Component Value Date   TSH 3.650 02/05/2020    Therapeutic Level Labs: No results found for: "LITHIUM" No results found for: "CBMZ" No results found for: "VALPROATE"  Current Medications: Current Outpatient Medications  Medication Sig Dispense Refill   Desloratadine-Pseudoephedrine (CLARINEX-D 12 HOUR PO) Take 1 tablet by mouth 2 (two) times daily.     LORazepam (ATIVAN) 0.5 MG tablet 1 qhs  1 qday PRN 45 tablet 3   ARIPiprazole (ABILIFY) 2 MG tablet 1 qam 30 tablet 3   lamoTRIgine (LAMICTAL) 200 MG tablet Take 1 tablet (200 mg total) by mouth daily. 30 tablet 3   venlafaxine XR (EFFEXOR-XR) 75 MG 24 hr capsule Take 3 capsules (225 mg total) by mouth daily with breakfast. 90 capsule 3   Current Facility-Administered Medications  Medication Dose Route Frequency Provider Last Rate Last Admin   0.9 %  sodium chloride infusion  500 mL Intravenous Once Gatha Mayer, MD        Musculoskeletal: Strength & Muscle Tone: within normal limits Gait & Station: normal Patient leans: N/A  Psychiatric Specialty Exam: Review of Systems  Blood pressure (!) 163/91, pulse 78, height '5\' 4"'$  (1.626 m), weight 153 lb (69.4 kg), last menstrual period 12/17/2007.Body mass index is 26.26 kg/m.  General Appearance: Casual  Eye Contact:  Good  Speech:  Clear and CoherentNormal  Volume:    Mood:  NA  Affect:  NA  Thought Process:  Goal Directed  Orientation:  NA  Thought Content:  WDL  Suicidal Thoughts:  No  Homicidal Thoughts:  No  Memory:  NA  Judgement:  Good  Insight:  Fair  Psychomotor Activity:  Normal  Concentration:    Recall:  Good  Fund of Knowledge:Good  Language: Good  Akathisia:   No  Handed:  Right  AIMS (if indicated):  not done  Assets:  Desire for Improvement  ADL's:  Intact  Cognition: WNL  Sleep:  Good   Screenings: GAD-7    Flowsheet Row Video Visit from 12/14/2021 in [facility] Video Visit from 09/24/2021 in [facility] Video Visit from 06/18/2021 in [facility] Video Visit from 01/20/2021  in [facility] Video Visit from 10/21/2020 in [place]  Total GAD-7 Score '13 18 9 14 9      '$ PHQ2-9    Flowsheet Row Counselor from 06/28/2022 in [place] Video Visit from 12/14/2021 in [facility] Video Visit from 09/24/2021 in [facility] Counselor from 06/26/2021 in [facility] Video Visit from 06/18/2021 in [place]  PHQ-2 Total Score '1 1 6 2 1  '$ PHQ-9 Total Score '8 4 13 6 5      '$ Flowsheet Row Counselor from 08/03/2022 in [place] Counselor from 06/28/2022 in [place] Video Visit from 12/14/2021 in [place] No Risk No Risk Error: Question 6 not populated       Assessment and Plan:   This patient's most likely diagnosis is major depression.  For now she will continue taking Effexor as prescribed.  For now she will continue taking 2 mg of Abilify.  When she returns in 3 months we will consider discontinuing the Abilify.  Patient is not against the idea that she just does not want to do it over the holidays.  Importantly she will continue taking Lamictal.  She no longer takes Ativan as needed.  She continues in one-to-one therapy with Margreta Journey in this office.  She has a very good relationship in therapy.  I think the patient is doing very well and will  be seen again in approximately 3 months.  Collaboration of Care:  Patient/Guardian was advised Release of Information must be obtained prior to any record release in order to collaborate their care with an outside provider. Patient/Guardian was advised if they have not already done so to contact the registration department to sign all necessary forms in order for [place] to release information regarding their care.   Consent: Patient/Guardian gives verbal consent for treatment and assignment of benefits for services provided during this visit. Patient/Guardian expressed understanding and agreed to proceed.   Jerral Ralph, MD 12/13/20234:37 PM

## 2022-11-02 ENCOUNTER — Ambulatory Visit (INDEPENDENT_AMBULATORY_CARE_PROVIDER_SITE_OTHER): Payer: Medicare HMO | Admitting: Licensed Clinical Social Worker

## 2022-11-02 DIAGNOSIS — F411 Generalized anxiety disorder: Secondary | ICD-10-CM | POA: Diagnosis not present

## 2022-11-02 DIAGNOSIS — F32A Depression, unspecified: Secondary | ICD-10-CM

## 2022-11-02 NOTE — Progress Notes (Signed)
Virtual Visit via Video Note  I connected with Cassandra Lynch on 11/30//23 at  1:00 PM EST by a video enabled telemedicine application and verified that I am speaking with the correct person using two identifiers.  Location: Patient: home Provider: remote office [name], [name])   I discussed the limitations of evaluation and management by telemedicine and the availability of in person appointments. The patient expressed understanding and agreed to proceed.   I discussed the assessment and treatment plan with the patient. The patient was provided an opportunity to ask questions and all were answered. The patient agreed with the plan and demonstrated an understanding of the instructions.   The patient was advised to call back or seek an in-person evaluation if the symptoms worsen or if the condition fails to improve as anticipated.  I provided 60 minutes of non-face-to-face time during this encounter.   Cassandra Lynch R Dorsey Charette, LCSW   THERAPIST PROGRESS NOTE  Session Time: 1-2p  Participation Level: Active  Behavioral Response: Neat and Well GroomedAlertDepressed  Type of Therapy: Individual Therapy  Treatment Goals addressed: Develop healthy thinking patterns and beliefs about self, others, and the world that lead to the alleviation and help prevent the relapse of depression per self report 3 out of 5 sessions documented   ProgressTowards Goals: Progressing  Interventions: CBT, Supportive, and Family Systems  Summary: Cassandra Lynch is a 67 y.o. female who presents with continuing symptoms related to depression diagnosis.   Allowed pt to explore and express thoughts and feelings associated with recent life situations and external stressors.Patient reports that she's been doing her own independent research about setting boundaries and setting limits with family members and others. Patient feels good about how she is being intentional about setting limits and boundaries with  people.  Patient reports that she's continuing to have stress associated with her grandson, and worries about her grandson and his overall well-being. Patient reports that she has anxiety in almost every setting that her grandson is in, and she had a heart to heart talk with her daughter recently. Patient reports her daughter stated that if patient is continuing to feel anxious at every single event then maybe she should not go to the events. Allow patient to explore this, and recognize some anxiety triggers, in some ways that patient can cope in the moment so that she continues to engage in events involving her grandson.   patient states that she worries about her daughter, and the friends of her daughter. Patient states that the children of her daughter's friends are often bullying her grandson, which again is a anxiety trigger in patient.  Allowed patient to examine choices that she made with her children when they were younger versus choices that her daughter is making currently. Allowed patient to explore pros and cons, and her own opinions about the differences.  Patient states that she often has low energy in the evenings, and will "sleep her evenings away". allow patient to identify different activities that she could engage in as an alternative to sleeping. Patient agrees to try to engage in these activities and limit the night time sleeping until bedtime.  Patient reports that she enjoys socially engaging with others, and recently joined a Facebook group focused on solo traveling. Patient reports that she also enjoys attending concerts, and is thinking about getting a second job. Encouraged patient to focus on engaging in activities that bring her happiness and joy.  Continued recommendations are as follows: self care behaviors, positive social  engagements, focusing on overall work/home/life balance, and focusing on positive physical and emotional wellness.   Suicidal/Homicidal:  No  Therapist Response: Pt is continuing to apply interventions learned in session into daily life situations. Pt is currently on track to meet goals utilizing interventions mentioned above. Personal growth and progress noted. Treatment to continue as indicated.   Pt shows enhanced capacity for managing depression and anxiety symptoms.   Plan: Return again in 4 weeks.  Diagnosis:  Encounter Diagnoses  Name Primary?   Mild depression Yes   Generalized anxiety disorder     Collaboration of Care: Other pt to continue care with psychiatrist of record, Dr. Norma Fredrickson.  Patient/Guardian was advised Release of Information must be obtained prior to any record release in order to collaborate their care with an outside provider. Patient/Guardian was advised if they have not already done so to contact the registration department to sign all necessary forms in order for [place] to release information regarding their care.   Consent: Patient/Guardian gives verbal consent for treatment and assignment of benefits for services provided during this visit. Patient/Guardian expressed understanding and agreed to proceed.   [place], LCSW 11/02/2022

## 2022-12-01 ENCOUNTER — Ambulatory Visit (INDEPENDENT_AMBULATORY_CARE_PROVIDER_SITE_OTHER): Payer: Medicare HMO | Admitting: Licensed Clinical Social Worker

## 2022-12-01 DIAGNOSIS — F32A Depression, unspecified: Secondary | ICD-10-CM | POA: Diagnosis not present

## 2022-12-01 DIAGNOSIS — F411 Generalized anxiety disorder: Secondary | ICD-10-CM

## 2022-12-01 NOTE — Progress Notes (Unsigned)
Virtual Visit via Video Note  I connected with Cassandra Lynch on 11/30//23 at  1:00 PM EST by a video enabled telemedicine application and verified that I am speaking with the correct person using two identifiers.  Pt provided verbal consent for Cassandra Bounds, LCSW (observing clinician) to be present throughout session.   Location: Patient: home Provider: [place] [facility]   I discussed the limitations of evaluation and management by telemedicine and the availability of in person appointments. The patient expressed understanding and agreed to proceed.   I discussed the assessment and treatment plan with the patient. The patient was provided an opportunity to ask questions and all were answered. The patient agreed with the plan and demonstrated an understanding of the instructions.   The patient was advised to call back or seek an in-person evaluation if the symptoms worsen or if the condition fails to improve as anticipated.  I provided 40 minutes of non-face-to-face time during this encounter.   Cassandra Lynch R Cassandra Mccorkel, LCSW   THERAPIST PROGRESS NOTE  Session Time: 1-140p  Participation Level: Active  Behavioral Response: Neat and Well GroomedAlertDepressed  Type of Therapy: Individual Therapy  Treatment Goals addressed: Develop healthy thinking patterns and beliefs about self, others, and the world that lead to the alleviation and help prevent the relapse of depression per self report 3 out of 5 sessions documented   ProgressTowards Goals: Progressing  Interventions: CBT, Supportive, and Family Systems  Summary: Cassandra Lynch is a 67 y.o. female who presents with continuing symptoms related to depression diagnosis.   Allowed pt to explore and express thoughts and feelings associated with recent life situations and external stressors.  Patient reports that relationship between herself and her sister have been improving somewhat.  Patient reports that  both of her parents are doing well, and she is happy about this.  Patient states that she puts her differences with her sister to the side and does visit her parents on a regular basis.  Patient reports that she has recent stress associated with one of her cats that have gotten really sick recently.  Patient states that treatments are expensive, and patient is at a crossroads and does not know what to do.  Patient reports that going into the vet's office is a visual trigger for her--patient states that she has had to bring a pet in in the past, and it was a very negative experience.  Reviewed ways of using grounding exercises to assist in managing the physiological symptoms patient experiences when she has been triggered.  Patient reports that she still experiences a lot of anxiety about the health of her son.  Patient reports that it is almost 1 year anniversary of her son's stroke.  Patient explored her thoughts and feelings associated with that day, and explored details that she can still remember about staff members, her son, and her own mentality when he was in the hospital.  Assisted patient with identifying comfort activities, and scenarios.  Encouraged patient to seek her " safe activities" every time intrusive thoughts pop in.  Patient reports that she is excited that she has made some new friends in the neighborhood, playing "bunko".  Encouraged continuing positive social engagement.  Continued recommendations are as follows: self care behaviors, positive social engagements, focusing on overall work/home/life balance, and focusing on positive physical and emotional wellness.   Suicidal/Homicidal: No  Therapist Response: Pt is continuing to apply interventions learned in session into daily life situations. Pt is currently on  track to meet goals utilizing interventions mentioned above. Personal growth and progress noted. Treatment to continue as indicated.   Plan: Return again in 4  weeks.  Diagnosis:  Encounter Diagnoses  Name Primary?   Mild depression Yes   Generalized anxiety disorder     Collaboration of Care: Other pt to continue care with psychiatrist of record, Dr. Norma Fredrickson.  Patient/Guardian was advised Release of Information must be obtained prior to any record release in order to collaborate their care with an outside provider. Patient/Guardian was advised if they have not already done so to contact the registration department to sign all necessary forms in order for [place] to release information regarding their care.   Consent: Patient/Guardian gives verbal consent for treatment and assignment of benefits for services provided during this visit. Patient/Guardian expressed understanding and agreed to proceed.   [place], LCSW 12/01/2022

## 2022-12-28 ENCOUNTER — Ambulatory Visit (INDEPENDENT_AMBULATORY_CARE_PROVIDER_SITE_OTHER): Payer: Medicare HMO | Admitting: Licensed Clinical Social Worker

## 2022-12-28 DIAGNOSIS — F411 Generalized anxiety disorder: Secondary | ICD-10-CM

## 2022-12-28 DIAGNOSIS — F32A Depression, unspecified: Secondary | ICD-10-CM | POA: Diagnosis not present

## 2022-12-28 NOTE — Progress Notes (Signed)
Virtual Visit via Video Note  I connected with Cassandra Lynch on 11/30//23 at  4:00 PM EDT by a video enabled telemedicine application and verified that I am speaking with the correct person using two identifiers.  Pt provided verbal consent for Katie Bounds, LCSW (observing clinician) to be present throughout session.   Location: Patient: home Provider: [place] Office   I discussed the limitations of evaluation and management by telemedicine and the availability of in person appointments. The patient expressed understanding and agreed to proceed.   I discussed the assessment and treatment plan with the patient. The patient was provided an opportunity to ask questions and all were answered. The patient agreed with the plan and demonstrated an understanding of the instructions.   The patient was advised to call back or seek an in-person evaluation if the symptoms worsen or if the condition fails to improve as anticipated.  I provided 40 minutes of non-face-to-face time during this encounter.   Kimberlea Schlag R Norrin Shreffler, LCSW   THERAPIST PROGRESS NOTE  Session Time: 6-440p  Participation Level: Active  Behavioral Response: Neat and Well GroomedAlertDepressed  Type of Therapy: Individual Therapy  Treatment Goals addressed: Develop healthy thinking patterns and beliefs about self, others, and the world that lead to the alleviation and help prevent the relapse of depression per self report 3 out of 5 sessions documented   ProgressTowards Goals: Progressing  Interventions: CBT, Supportive, and Family Systems  Summary: Cassandra Lynch is a 67 y.o. female who presents with continuing symptoms related to depression diagnosis.  Patient reports that overall mood is stable and that she is managing situational stressors well.  Allowed pt to explore and express thoughts and feelings associated with recent life situations and external stressors.  Patient reports that  she is currently still not speaking to her sister, but is visiting her parents regularly.  Patient states that she has daily contact with her niece even though she is not speaking with the niece's mom.  Patient reports that she is attending bunko games with neighbors, so patient views this as positive social interaction.  Patient states that she still has some concerns about her neighbor that she has gotten into an argument with in the past.  Patient states that she feels that this neighbor triggers her and intentionally does things to annoy her at times.  Patient states that she is excited that her daughter got another acting job in [employer] is making plans to buy plane tickets and travel to [place] to see her in the play very soon.  Continued recommendations are as follows: self care behaviors, positive social engagements, focusing on overall work/home/life balance, and focusing on positive physical and emotional wellness.   Suicidal/Homicidal: No  Therapist Response: Pt is continuing to apply interventions learned in session into daily life situations. Pt is currently on track to meet goals utilizing interventions mentioned above. Personal growth and progress noted. Treatment to continue as indicated.   Plan: Return again in 4 weeks.  Diagnosis:  Encounter Diagnoses  Name Primary?   Mild depression Yes   Generalized anxiety disorder    Collaboration of Care: Other pt to continue care with psychiatrist of record, Dr. Norma Fredrickson.  Patient/Guardian was advised Release of Information must be obtained prior to any record release in order to collaborate their care with an outside provider. Patient/Guardian was advised if they have not already done so to contact the registration department to sign all necessary forms in order for  [place] to release information regarding their care.   Consent: Patient/Guardian gives verbal consent for treatment and assignment of benefits for services  provided during this visit. Patient/Guardian expressed understanding and agreed to proceed.   [place], LCSW 12/28/2022

## 2022-12-29 ENCOUNTER — Encounter (HOSPITAL_COMMUNITY): Payer: Self-pay | Admitting: Psychiatry

## 2022-12-29 ENCOUNTER — Ambulatory Visit (HOSPITAL_COMMUNITY): Payer: Medicare HMO | Admitting: Psychiatry

## 2022-12-29 VITALS — BP 102/70 | HR 82 | Ht 64.0 in | Wt 156.0 lb

## 2022-12-29 DIAGNOSIS — F32A Depression, unspecified: Secondary | ICD-10-CM

## 2022-12-29 DIAGNOSIS — F411 Generalized anxiety disorder: Secondary | ICD-10-CM

## 2022-12-29 DIAGNOSIS — F3342 Major depressive disorder, recurrent, in full remission: Secondary | ICD-10-CM | POA: Diagnosis not present

## 2022-12-29 MED ORDER — VENLAFAXINE HCL ER 75 MG PO CP24: 225.0000 mg | ORAL_CAPSULE | Freq: Every day | ORAL | 3 refills | Status: DC

## 2022-12-29 MED ORDER — LAMOTRIGINE 200 MG PO TABS: 200.0000 mg | ORAL_TABLET | Freq: Every day | ORAL | 3 refills | Status: DC

## 2022-12-29 NOTE — Progress Notes (Signed)
Psychiatric Initial Adult Assessment   Patient Identification: Cassandra Lynch MRN:  [id number] Date of Evaluation:  12/29/2022 Referral Source: Ronne Binning Chief Complaint: Depression  Today the patient is doing very well.  The conflict with her parents and her sister seems to have resolved.  Her sister is back down to let her parents be more independent.  They seem to be doing better.  While she does not have a great relationship with her sister everybody seems to be getting along better.  She takes care of her grandchildren and they are doing well.  Her son who had a stroke is stable.  The patient has demonstrated no evidence of psychosis and her mood has been very stable.  She drinks no alcohol and uses no drugs.      Visit Diagnosis:    ICD-10-CM   1. Generalized anxiety disorder  F41.1 venlafaxine XR (EFFEXOR-XR) 75 MG 24 hr capsule    2. Mild depression  F32.A venlafaxine XR (EFFEXOR-XR) 75 MG 24 hr capsule    lamoTRIgine (LAMICTAL) 200 MG tablet      History of Present Illness: See above  Associated Signs/Symptoms: Depression Symptoms:  depressed mood, (Hypo) Manic Symptoms:   Anxiety Symptoms:   Psychotic Symptoms:   PTSD Symptoms: NA  Past Psychiatric History: Multiple psychotropic medicines and psychotherapy  Previous Psychotropic Medications: Yes   Substance Abuse History in the last 12 months:  No.  Consequences of Substance Abuse: NA  Past Medical History:  Past Medical History:  Diagnosis Date   Abnormal Pap smear of cervix 09/17/2015   LSIL CIN1 HR HPV:+detected   Allergy    Anxiety    Depression    Fibroids 05/2000   Personal history of colonic adenomas 08/27/2013   Smoker     Past Surgical History:  Procedure Laterality Date   CESAREAN SECTION     x 2   CHOLECYSTECTOMY, LAPAROSCOPIC  1989   COLONOSCOPY     FINGER SURGERY Right 2007   pointer, severed tendons   POLYPECTOMY      Family Psychiatric History:   Family History:  Family History   Problem Relation Age of Onset   Colon polyps Father    Colon polyps Brother    Heart disease Brother    Heart disease Mother    Colon cancer Neg Hx    Esophageal cancer Neg Hx    Rectal cancer Neg Hx    Stomach cancer Neg Hx     Social History:   Social History   Socioeconomic History   Marital status: Divorced    Spouse name: Not on file   Number of children: 2   Years of education: Not on file   Highest education level: Not on file  Occupational History   Not on file  Tobacco Use   Smoking status: Every Day    Packs/day: 1.00    Years: 30.00    Total pack years: 30.00    Types: Cigarettes   Smokeless tobacco: Never  Vaping Use   Vaping Use: Never used  Substance and Sexual Activity   Alcohol use: Yes    Alcohol/week: 1.0 standard drink of alcohol    Types: 1 Glasses of wine per week    Comment: occasionally   Drug use: No   Sexual activity: Not Currently    Partners: Male    Birth control/protection: Post-menopausal  Other Topics Concern   Not on file  Social History Narrative   Not on file   Social Determinants  of Health   Financial Resource Strain: Not on file  Food Insecurity: Not on file  Transportation Needs: Not on file  Physical Activity: Not on file  Stress: Not on file  Social Connections: Not on file    Additional Social History:   Allergies:   Allergies  Allergen Reactions   Biaxin [Clarithromycin] Hives   Ceftin [Cefuroxime Axetil]    Vancomycin     Metabolic Disorder Labs: Lab Results  Component Value Date   HGBA1C 5.4 02/05/2020   MPG 103 11/12/2016   No results found for: "PROLACTIN" Lab Results  Component Value Date   CHOL 187 02/05/2020   TRIG 145 02/05/2020   HDL 52 02/05/2020   CHOLHDL 3.6 02/05/2020   VLDL 27 11/12/2016   LDLCALC 109 (H) 02/05/2020   LDLCALC 162 (H) 11/12/2016   Lab Results  Component Value Date   TSH 3.650 02/05/2020    Therapeutic Level Labs: No results found for: "LITHIUM" No results  found for: "CBMZ" No results found for: "VALPROATE"  Current Medications: Current Outpatient Medications  Medication Sig Dispense Refill   Desloratadine-Pseudoephedrine (CLARINEX-D 12 HOUR PO) Take 1 tablet by mouth 2 (two) times daily.     LORazepam (ATIVAN) 0.5 MG tablet 1 qhs  1 qday PRN 45 tablet 3   lamoTRIgine (LAMICTAL) 200 MG tablet Take 1 tablet (200 mg total) by mouth daily. 30 tablet 3   venlafaxine XR (EFFEXOR-XR) 75 MG 24 hr capsule Take 3 capsules (225 mg total) by mouth daily with breakfast. 90 capsule 3   Current Facility-Administered Medications  Medication Dose Route Frequency Provider Last Rate Last Admin   0.9 %  sodium chloride infusion  500 mL Intravenous Once Gatha Mayer, MD        Musculoskeletal: Strength & Muscle Tone: within normal limits Gait & Station: normal Patient leans: N/A  Psychiatric Specialty Exam: Review of Systems  Blood pressure 102/70, pulse 82, height '5\' 4"'$  (1.626 m), weight 156 lb (70.8 kg), last menstrual period 12/17/2007.Body mass index is 26.78 kg/m.  General Appearance: Casual  Eye Contact:  Good  Speech:  Clear and CoherentNormal  Volume:    Mood:  NA  Affect:  NA  Thought Process:  Goal Directed  Orientation:  NA  Thought Content:  WDL  Suicidal Thoughts:  No  Homicidal Thoughts:  No  Memory:  NA  Judgement:  Good  Insight:  Fair  Psychomotor Activity:  Normal  Concentration:    Recall:  Good  Fund of Knowledge:Good  Language: Good  Akathisia:  No  Handed:  Right  AIMS (if indicated):  not done  Assets:  Desire for Improvement  ADL's:  Intact  Cognition: WNL  Sleep:  Good   Screenings: GAD-7    Flowsheet Row Video Visit from 12/14/2021 in [facility] Video Visit from 09/24/2021 in [facility] Video Visit from 06/18/2021 in [facility] Video Visit from 01/20/2021 in [facility] Video Visit from  10/21/2020 in [facility]  Total GAD-7 Score '13 18 9 14 9      '$ PHQ2-9    Flowsheet Row Counselor from 12/28/2022 in [place] at [facility] from 06/28/2022 in [place] at [facility] Video Visit from 12/14/2021 in [facility] Video Visit from 09/24/2021 in [facility] Counselor from 06/26/2021 in [facility]  PHQ-2 Total  Score 0 '1 1 6 2  '$ PHQ-9 Total Score -- '8 4 13 6      '$ Flowsheet Row Counselor from 12/28/2022 in [place] at [facility] from 08/03/2022 in [place] at [facility] from 06/28/2022 in [place] at [place] No Risk No Risk No Risk       Assessment and Plan:    This patient's diagnosis is major depression.  At this time she is in remission.  Today we will discontinue her Abilify.  She will continue taking Effexor and Lamictal as prescribed.  She will continue in one-to-one therapy with Christina.  The patient is doing very well.  She will return to see me in 3 to 4 months. Collaboration of Care:  Patient/Guardian was advised Release of Information must be obtained prior to any record release in order to collaborate their care with an outside provider. Patient/Guardian was advised if they have not already done so to contact the registration department to sign all necessary forms in order for [place] to release information regarding their care.   Consent: Patient/Guardian gives verbal consent for treatment and assignment of benefits for services provided during this visit. Patient/Guardian expressed understanding and agreed to proceed.   Jerral Ralph, MD 3/13/20244:21 PM

## 2023-01-27 ENCOUNTER — Telehealth (HOSPITAL_COMMUNITY): Payer: Self-pay | Admitting: Licensed Clinical Social Worker

## 2023-01-27 ENCOUNTER — Ambulatory Visit (INDEPENDENT_AMBULATORY_CARE_PROVIDER_SITE_OTHER): Payer: Medicare HMO | Admitting: Licensed Clinical Social Worker

## 2023-01-27 DIAGNOSIS — F32A Depression, unspecified: Secondary | ICD-10-CM | POA: Diagnosis not present

## 2023-01-27 DIAGNOSIS — F411 Generalized anxiety disorder: Secondary | ICD-10-CM | POA: Diagnosis not present

## 2023-01-27 NOTE — Telephone Encounter (Signed)
Clinician returned phone call--pt had requested that clinician reach out to her.  Offered pt 2:00pm spot that is open today--left message on voice mail for pt to call our office back to confirm or decline whether she would like to take open spot.

## 2023-01-27 NOTE — Progress Notes (Signed)
Virtual Visit via Video Note  I connected with Cassandra Lynch on 01/27/2023 at  2:00 PM EDT by a video enabled telemedicine application and verified that I am speaking with the correct person using two identifiers.  Location: Patient: home Provider: [facility]   I discussed the limitations of evaluation and management by telemedicine and the availability of in person appointments. The patient expressed understanding and agreed to proceed.   I discussed the assessment and treatment plan with the patient. The patient was provided an opportunity to ask questions and all were answered. The patient agreed with the plan and demonstrated an understanding of the instructions.   The patient was advised to call back or seek an in-person evaluation if the symptoms worsen or if the condition fails to improve as anticipated.  I provided 40 minutes of non-face-to-face time during this encounter.   Uthman Mroczkowski R Etoy Mcdonnell, LCSW   THERAPIST PROGRESS NOTE  Session Time: 2-240p  Participation Level: Active  Behavioral Response: Neat and Well GroomedAlertDepressed  Type of Therapy: Individual Therapy  Treatment Goals addressed: Develop healthy thinking patterns and beliefs about self, others, and the world that lead to the alleviation and help prevent the relapse of depression per self report 3 out of 5 sessions documented   ProgressTowards Goals: Progressing  Interventions: CBT, Supportive, and Family Systems  Summary: Cassandra Lynch is a 67 y.o. female who presents with escalating symptoms related to depression diagnosis.  Patient reports that her mood is fluctuating, and that she is having tearful episodes on a daily basis.  Patient reports that this has been happening ever since her psychiatrist recommended that she discontinue Abilify.  Discussed with patient that it can be up to 4 to 5 weeks before her mood settles from this.  Encouraged patient to call back in 1  to 2 weeks or sooner if her symptoms continue to worsen.  Patient reflects understanding  Clinician assisted pt with identifying situations/scenarios/schemas triggering anxiety and/or depression symptoms. Allowed pt to explore and express thoughts and feelings and discussed current coping mechanisms. Reviewed changes/recommendations.  Patient states that she got very upset when utility workers moved all of her plants around on her patio, and she had spent a lot of time and energy to get everything exactly the way that she wanted it.  Patient was very disappointed when the job was finished, but they were unable to complete what they initially came for (still need to bury wires under the ground).  Patient states that she really is having a bad day and wants to go and visit her mother, but she feels that her sister will be a barrier to this.  Encouraged patient to go and visit her mother, because she has every right to be there.  Encouraged patient to be civil with her sister, but to let her sister know that she is having a bad day and she really wants to visit with her mother.  Allow patient to explore the conflicts between herself and her sister, and discuss what needs to happen to make patient feel more comfortable visiting her parents.  Patient states " my sister needs to apologize".  Patient also reports that she has very specific worries about several people: She is worried about her brother, who is going in for heart testing, she is worried about her ex of 31 years--he is currently struggling with heart condition, and patient continues to worry about her son, and his heart.  Patient states that her neighbor  makes her feel like a prisoner in her own home--patient states that she is fearful at times to take her dog for a walk, or to take her trash out because she is fearful her neighbor will be there and make a statement.  Continued to encourage patient to focus on her self-care, and recovering from this  episode.  Encouraged her to focus on the present, and to be mindful when she is starting to worry about other people or things in the future, and to use her grounding exercises to bring her back to the present mindset.  Continued recommendations are as follows: self care behaviors, positive social engagements, focusing on overall work/home/life balance, and focusing on positive physical and emotional wellness.   Suicidal/Homicidal: No  Therapist Response: Pt is continuing to apply interventions learned in session into daily life situations. Pt is currently on track to meet goals utilizing interventions mentioned above. Personal growth and progress noted. Treatment to continue as indicated.   Plan: Return again in 4 weeks.  Diagnosis:  Encounter Diagnoses  Name Primary?   Mild depression Yes   Generalized anxiety disorder     Collaboration of Care: Other pt to continue care with psychiatrist of record, Dr. Archer AsaGerald Plovsky.  Patient/Guardian was advised Release of Information must be obtained prior to any record release in order to collaborate their care with an outside provider. Patient/Guardian was advised if they have not already done so to contact the registration department to sign all necessary forms in order for us to release information regarding their care.   Consent: Patient/Guardian gives verbal consent for treatment and assignment of benefits for services provided during this visit. Patient/Guardian expressed understanding and agreed to proceed.   Ernest HaberChristina R Kasidy Gianino, LCSW 01/27/2023

## 2023-01-31 ENCOUNTER — Ambulatory Visit (INDEPENDENT_AMBULATORY_CARE_PROVIDER_SITE_OTHER): Payer: Medicare HMO | Admitting: Licensed Clinical Social Worker

## 2023-01-31 DIAGNOSIS — F411 Generalized anxiety disorder: Secondary | ICD-10-CM

## 2023-01-31 DIAGNOSIS — F32A Depression, unspecified: Secondary | ICD-10-CM

## 2023-01-31 NOTE — Progress Notes (Signed)
Virtual Visit via Video Note  I connected with Cassandra Lynch on 01/31/23 at  3:00 PM EDT by a video enabled telemedicine application and verified that I am speaking with the correct person using two identifiers.  Location: Patient: hospital (mother having surgery) Provider: remote office [place], [place])   I discussed the limitations of evaluation and management by telemedicine and the availability of in person appointments. The patient expressed understanding and agreed to proceed.   I discussed the assessment and treatment plan with the patient. The patient was provided an opportunity to ask questions and all were answered. The patient agreed with the plan and demonstrated an understanding of the instructions.   The patient was advised to call back or seek an in-person evaluation if the symptoms worsen or if the condition fails to improve as anticipated.  I provided 34 minutes of non-face-to-face time during this encounter.   Clemence Lengyel R Tarron Krolak, LCSW   THERAPIST PROGRESS NOTE  Session Time: 3-334p  Participation Level: Active  Behavioral Response: Neat and Well GroomedAlertDepressed  Type of Therapy: Individual Therapy  Treatment Goals addressed: Develop healthy thinking patterns and beliefs about self, others, and the world that lead to the alleviation and help prevent the relapse of depression per self report 3 out of 5 sessions documented   ProgressTowards Goals: Progressing  Interventions: CBT, Supportive, and Family Systems  Summary: SHERMA VANMETRE is a 67 y.o. female who presents with escalating symptoms related to depression diagnosis.  Patient reports that she made the decision to continue her Abilify after recently discontinued.  Patient reports that she immediately felt better after restarting the Abilify.  Patient reports that her mood is more stable and her anxiety levels have decreased significantly.  Clinician assisted pt with identifying  situations/scenarios/schemas triggering anxiety and/or depression symptoms. Allowed pt to explore and express thoughts and feelings and discussed current coping mechanisms. Reviewed changes/recommendations.  Patient reports that one of her major triggers currently is her mother recently fell and broke her hip.  Patient also reports that her brother needs heart stent put in soon, so she is having to put her differences with her sister to the side so that they can both the caregivers to their mother and father.  Patient reports that her father is not being very helpful, and is expecting that others will continue to assist him with his needs.  Patient is currently at the hospital waiting for her mother to finish surgery.  Patient reports that she is feeling more more serious about making the decision to sell her condo.  Encouraged patient to wait until she is not under the influence of emotion before making any big life decisions.  Continue to encourage self-care, since patient is going to be playing a significant caregiver role for her brother, her mother, and her father.  Continued recommendations are as follows: self care behaviors, positive social engagements, focusing on overall work/home/life balance, and focusing on positive physical and emotional wellness.   Suicidal/Homicidal: No  Therapist Response: Pt is continuing to apply interventions learned in session into daily life situations. Pt is currently on track to meet goals utilizing interventions mentioned above. Personal growth and progress noted. Treatment to continue as indicated.   Plan: Return again in 4 weeks.  Diagnosis:  Encounter Diagnoses  Name Primary?   Mild depression Yes   Generalized anxiety disorder      Collaboration of Care: Other pt to continue care with psychiatrist of record, Dr. Archer Asa.  Patient/Guardian was advised  Release of Information must be obtained prior to any record release in order to collaborate  their care with an outside provider. Patient/Guardian was advised if they have not already done so to contact the registration department to sign all necessary forms in order for [place] to release information regarding their care.   Consent: Patient/Guardian gives verbal consent for treatment and assignment of benefits for services provided during this visit. Patient/Guardian expressed understanding and agreed to proceed.   Ernest Haber Lanice Folden, LCSW 01/31/2023

## 2023-02-07 ENCOUNTER — Telehealth (HOSPITAL_COMMUNITY): Payer: Self-pay | Admitting: Licensed Clinical Social Worker

## 2023-02-07 NOTE — Telephone Encounter (Signed)
Pt called the office and requested that the clinician contact her. Attempted to reach pt unsuccessfully at 3:56pm.  Left message on pt voice mail stating that I would try back at next break in sessions.

## 2023-02-08 NOTE — Telephone Encounter (Signed)
Clinician could not call pt on phone due to losing voice suddenly--clinician asked office staff to contact pt with additional times that have opened up in the next few days. Pt made statement that she is doing better and will just keep her appointed time.   1:05pm

## 2023-03-02 ENCOUNTER — Ambulatory Visit (HOSPITAL_BASED_OUTPATIENT_CLINIC_OR_DEPARTMENT_OTHER): Payer: Medicare HMO | Admitting: Psychiatry

## 2023-03-02 DIAGNOSIS — F32A Depression, unspecified: Secondary | ICD-10-CM | POA: Diagnosis not present

## 2023-03-02 DIAGNOSIS — F411 Generalized anxiety disorder: Secondary | ICD-10-CM

## 2023-03-02 DIAGNOSIS — F324 Major depressive disorder, single episode, in partial remission: Secondary | ICD-10-CM

## 2023-03-02 MED ORDER — VENLAFAXINE HCL ER 75 MG PO CP24: 225.0000 mg | ORAL_CAPSULE | Freq: Every day | ORAL | 3 refills | Status: DC

## 2023-03-02 MED ORDER — ARIPIPRAZOLE 2 MG PO TABS: 2.0000 mg | ORAL_TABLET | Freq: Every day | ORAL | 5 refills | Status: DC

## 2023-03-02 MED ORDER — LAMOTRIGINE 200 MG PO TABS: 200.0000 mg | ORAL_TABLET | Freq: Every day | ORAL | 3 refills | Status: DC

## 2023-03-02 NOTE — Progress Notes (Signed)
Psychiatric Initial Adult Assessment   Patient Identification: Cassandra Lynch MRN:  [id number] Date of Evaluation:  03/02/2023 Referral Source: Doyne Keel Chief Complaint: Depression   Today the patient was seen as an adult.  On her last visit we discontinued her Abilify within a month she started to feel depressed.  We restarted back to 2 mg.  She will continue taking all the other medications she is taking including her Effexor.  Patient continues in one-to-one therapy.  Patient will be seen again in 4 months.  Now her mood is good.  She is sleeping and eating well.  She enjoys going to [place].     Visit Diagnosis:    ICD-10-CM   1. Generalized anxiety disorder  F41.1 venlafaxine XR (EFFEXOR-XR) 75 MG 24 hr capsule    2. Mild depression  F32.A venlafaxine XR (EFFEXOR-XR) 75 MG 24 hr capsule    lamoTRIgine (LAMICTAL) 200 MG tablet      History of Present Illness: See above  Associated Signs/Symptoms: Depression Symptoms:  depressed mood, (Hypo) Manic Symptoms:   Anxiety Symptoms:   Psychotic Symptoms:   PTSD Symptoms: NA  Past Psychiatric History: Multiple psychotropic medicines and psychotherapy  Previous Psychotropic Medications: Yes   Substance Abuse History in the last 12 months:  No.  Consequences of Substance Abuse: NA  Past Medical History:  Past Medical History:  Diagnosis Date   Abnormal Pap smear of cervix 09/17/2015   LSIL CIN1 HR HPV:+detected   Allergy    Anxiety    Depression    Fibroids 05/2000   Personal history of colonic adenomas 08/27/2013   Smoker     Past Surgical History:  Procedure Laterality Date   CESAREAN SECTION     x 2   CHOLECYSTECTOMY, LAPAROSCOPIC  1989   COLONOSCOPY     FINGER SURGERY Right 2007   pointer, severed tendons   POLYPECTOMY      Family Psychiatric History:   Family History:  Family History  Problem Relation Age of Onset   Colon polyps Father    Colon polyps Brother    Heart disease Brother    Heart  disease Mother    Colon cancer Neg Hx    Esophageal cancer Neg Hx    Rectal cancer Neg Hx    Stomach cancer Neg Hx     Social History:   Social History   Socioeconomic History   Marital status: Divorced    Spouse name: Not on file   Number of children: 2   Years of education: Not on file   Highest education level: Not on file  Occupational History   Not on file  Tobacco Use   Smoking status: Every Day    Packs/day: 1.00    Years: 30.00    Additional pack years: 0.00    Total pack years: 30.00    Types: Cigarettes   Smokeless tobacco: Never  Vaping Use   Vaping Use: Never used  Substance and Sexual Activity   Alcohol use: Yes    Alcohol/week: 1.0 standard drink of alcohol    Types: 1 Glasses of wine per week    Comment: occasionally   Drug use: No   Sexual activity: Not Currently    Partners: Male    Birth control/protection: Post-menopausal  Other Topics Concern   Not on file  Social History Narrative   Not on file   Social Determinants of Health   Financial Resource Strain: Not on file  Food Insecurity: Not on file  Transportation Needs: Not on file  Physical Activity: Not on file  Stress: Not on file  Social Connections: Not on file    Additional Social History:   Allergies:   Allergies  Allergen Reactions   Biaxin [Clarithromycin] Hives   Ceftin [Cefuroxime Axetil]    Vancomycin     Metabolic Disorder Labs: Lab Results  Component Value Date   HGBA1C 5.4 02/05/2020   MPG 103 11/12/2016   No results found for: "PROLACTIN" Lab Results  Component Value Date   CHOL 187 02/05/2020   TRIG 145 02/05/2020   HDL 52 02/05/2020   CHOLHDL 3.6 02/05/2020   VLDL 27 11/12/2016   LDLCALC 109 (H) 02/05/2020   LDLCALC 162 (H) 11/12/2016   Lab Results  Component Value Date   TSH 3.650 02/05/2020    Therapeutic Level Labs: No results found for: "LITHIUM" No results found for: "CBMZ" No results found for: "VALPROATE"  Current Medications: Current  Outpatient Medications  Medication Sig Dispense Refill   ARIPiprazole (ABILIFY) 2 MG tablet Take 1 tablet (2 mg total) by mouth daily. 30 tablet 5   Desloratadine-Pseudoephedrine (CLARINEX-D 12 HOUR PO) Take 1 tablet by mouth 2 (two) times daily.     lamoTRIgine (LAMICTAL) 200 MG tablet Take 1 tablet (200 mg total) by mouth daily. 30 tablet 3   LORazepam (ATIVAN) 0.5 MG tablet 1 qhs  1 qday PRN 45 tablet 3   venlafaxine XR (EFFEXOR-XR) 75 MG 24 hr capsule Take 3 capsules (225 mg total) by mouth daily with breakfast. 90 capsule 3   Current Facility-Administered Medications  Medication Dose Route Frequency Provider Last Rate Last Admin   0.9 %  sodium chloride infusion  500 mL Intravenous Once Iva Boop, MD        Musculoskeletal: Strength & Muscle Tone: within normal limits Gait & Station: normal Patient leans: N/A  Psychiatric Specialty Exam: Review of Systems  Last menstrual period 12/17/2007.There is no height or weight on file to calculate BMI.  General Appearance: Casual  Eye Contact:  Good  Speech:  Clear and CoherentNormal  Volume:    Mood:  NA  Affect:  NA  Thought Process:  Goal Directed  Orientation:  NA  Thought Content:  WDL  Suicidal Thoughts:  No  Homicidal Thoughts:  No  Memory:  NA  Judgement:  Good  Insight:  Fair  Psychomotor Activity:  Normal  Concentration:    Recall:  Good  Fund of Knowledge:Good  Language: Good  Akathisia:  No  Handed:  Right  AIMS (if indicated):  not done  Assets:  Desire for Improvement  ADL's:  Intact  Cognition: WNL  Sleep:  Good   Screenings: GAD-7    Flowsheet Row Video Visit from 12/14/2021 in [facility] Video Visit from 09/24/2021 in [facility] Video Visit from 06/18/2021 in [facility] Video Visit from 01/20/2021 in [facility] Video Visit from 10/21/2020 in [facility]   Total GAD-7 Score 13 18 9 14 9       PHQ2-9    Flowsheet Row Counselor from 12/28/2022 in [facility] at [facility] from 06/28/2022 in [facility] Outpatient Behavioral Health at [facility] Video Visit from 12/14/2021 in [facility] Video Visit from 09/24/2021 in [facility] Counselor from 06/26/2021 in [facility]  PHQ-2 Total Score 0 1 1 6 2   PHQ-9  Total Score -- 8 4 13 6       Flowsheet Row Counselor from 01/27/2023 in [facility] Outpatient Behavioral Health at [facility] from 12/28/2022 in [facility] Health Outpatient Behavioral Health at [facility] from 08/03/2022 in [facility] Health Outpatient Behavioral Health at [facility] RISK CATEGORY No Risk No Risk No Risk       Assessment and Plan:   This patient's diagnosis is major depression.  It is evident that she needs Abilify 2 mg in addition to her venlafaxine.  She also takes Lamictal.  She will continue these medicines and return to see me in 4 months.  She is actually very stable. Consent: Patient/Guardian gives verbal consent for treatment and assignment of benefits for services provided during this visit. Patient/Guardian expressed understanding and agreed to proceed.   Gypsy Balsam, MD 5/15/20244:26 PM

## 2023-03-07 ENCOUNTER — Ambulatory Visit (INDEPENDENT_AMBULATORY_CARE_PROVIDER_SITE_OTHER): Payer: Medicare HMO | Admitting: Licensed Clinical Social Worker

## 2023-03-07 DIAGNOSIS — F411 Generalized anxiety disorder: Secondary | ICD-10-CM | POA: Diagnosis not present

## 2023-03-07 DIAGNOSIS — F324 Major depressive disorder, single episode, in partial remission: Secondary | ICD-10-CM

## 2023-03-07 NOTE — Progress Notes (Signed)
Virtual Visit via Video Note  I connected with Cassandra Lynch on 03/07/23 at  9:00 AM EDT by a video enabled telemedicine application and verified that I am speaking with the correct person using two identifiers.  Location: Patient: hospital (mother having surgery) Provider: remote office [place], [place])   I discussed the limitations of evaluation and management by telemedicine and the availability of in person appointments. The patient expressed understanding and agreed to proceed.   I discussed the assessment and treatment plan with the patient. The patient was provided an opportunity to ask questions and all were answered. The patient agreed with the plan and demonstrated an understanding of the instructions.   The patient was advised to call back or seek an in-person evaluation if the symptoms worsen or if the condition fails to improve as anticipated.  I provided 39 minutes of non-face-to-face time during this encounter.   Ceciley Buist R Kaileah Shevchenko, LCSW   THERAPIST PROGRESS NOTE  Session Time: [phone]  Participation Level: Active  Behavioral Response: Neat and Well GroomedAlertDepressed  Type of Therapy: Individual Therapy  Treatment Goals addressed: Develop healthy thinking patterns and beliefs about self, others, and the world that lead to the alleviation and help prevent the relapse of depression per self report 3 out of 5 sessions documented   ProgressTowards Goals: Progressing  Interventions: CBT, Supportive, and Family Systems  Summary: DUA DISHON is a 67 y.o. female who presents with improving  symptoms related to depression diagnosis. Pt reports that she is compliant with  medication and feels that her overall mood is stable. Pt reports that she is managing situational stressors well and is getting good quality and quantity of sleep.   Clinician assisted pt with identifying situations/scenarios/schemas triggering anxiety and/or depression symptoms. Allowed pt to  explore and express thoughts and feelings and discussed current coping mechanisms. Reviewed changes/recommendations.  Pt reports that she is feeling so much better after her "problem neighbor" moved out. Pt reports that she has had issues/concerns about this neighbor for years and found it ironic that she had made decisions to move forward with selling her condo (had appt with realtor) on the very day that her neighbors moved out. Pt states that she feels a weight has been lifted from her shoulders. Pt feels she can spend quality time outside now without worrying about her neighbor harassing her. Pt excited about upcoming trip to [place] to see her daughter in a play and to visit with a friend that lives on [place].   Assisted pt with identifying stress associated with caring for their mother with dementia. Discussed quantity of visits and quality of life for pts mother. Discussed importance of living in present. . Provided pt with positive support and encouragement as pt explored current stressors. Reviewed local and/or online supports and resources. Pt feels relationship with sister improved.   Continued recommendations are as follows: self care behaviors, positive social engagements, focusing on overall work/home/life balance, and focusing on positive physical and emotional wellness.   Suicidal/Homicidal: No  Therapist Response: Pt is continuing to apply interventions learned in session into daily life situations. Pt is currently on track to meet goals utilizing interventions mentioned above. Personal growth and progress noted. Treatment to continue as indicated.   Plan: Return again in 4 weeks.  Diagnosis:  Encounter Diagnoses  Name Primary?   Major depressive disorder with single episode, in partial remission (HCC) Yes   Generalized anxiety disorder     Collaboration of Care: Other pt to  continue care with psychiatrist of record, Dr. Archer Asa.  Patient/Guardian was advised Release of  Information must be obtained prior to any record release in order to collaborate their care with an outside provider. Patient/Guardian was advised if they have not already done so to contact the registration department to sign all necessary forms in order for [place] to release information regarding their care.   Consent: Patient/Guardian gives verbal consent for treatment and assignment of benefits for services provided during this visit. Patient/Guardian expressed understanding and agreed to proceed.   Ernest Haber Sohil Timko, LCSW 03/07/2023

## 2023-04-06 ENCOUNTER — Ambulatory Visit (HOSPITAL_COMMUNITY): Payer: Medicare HMO | Admitting: Licensed Clinical Social Worker

## 2023-04-18 ENCOUNTER — Ambulatory Visit (INDEPENDENT_AMBULATORY_CARE_PROVIDER_SITE_OTHER): Payer: Medicare HMO | Admitting: Licensed Clinical Social Worker

## 2023-04-18 DIAGNOSIS — F411 Generalized anxiety disorder: Secondary | ICD-10-CM

## 2023-04-18 DIAGNOSIS — F324 Major depressive disorder, single episode, in partial remission: Secondary | ICD-10-CM | POA: Diagnosis not present

## 2023-04-18 NOTE — Patient Instructions (Signed)
Outpatient Psychiatry and Counseling  FOR CRISIS:  call 911, [facility]: Mobile Crisis Management 24 hours:  [phone], call [phone], [facility] ([facility]) [address] walk in, or go to your local EMERGENCY DEPARTMENT  [facility] sliding scale fee and walk in schedule: M-F 8am-12pm/1pm-3pm [address]  [place], [place] [address] [phone]  [facility] [address] [place], [place] [address] [phone]  [facility]/ Intensive Outpatient Therapy Program/CDIOP/PHP [address] [place], [place] [address] [phone]  [facility]                  Crisis Services, Outpatient Therapy Services, Walk in Services      [phone]     [address]    [place], [place] [address]                 [facility]   [facility] [phone] [address] [place], [place] [address]  [facility]          [address],  [place], [place] [address]       [phone]  [facility] [address] [place], [place] [address] [phone]  [facility]    [address]    [place], [place] [address]     [phone]       [facility] [address] [place] [address]  [facility]     [address]     [place], [place]      [phone]       [facility] Shamsher Ahluwalia, MD [address] [place], [place] [address] [phone]  [facility]     [address]     [place], [place] [address]     [phone]       [facility] [address] [place], [place] [address] [phone] Resources for Temporary Residential Assistance/Crisis Centers  

## 2023-04-18 NOTE — Progress Notes (Signed)
Virtual Visit via Video Note  I connected with ITZELLE HERDEGEN on 04/18/23 at  3:00 PM EDT by a video enabled telemedicine application and verified that I am speaking with the correct person using two identifiers.  Location: Patient: hospital (mother having surgery) Provider: remote office [place], [place])   I discussed the limitations of evaluation and management by telemedicine and the availability of in person appointments. The patient expressed understanding and agreed to proceed.   I discussed the assessment and treatment plan with the patient. The patient was provided an opportunity to ask questions and all were answered. The patient agreed with the plan and demonstrated an understanding of the instructions.   The patient was advised to call back or seek an in-person evaluation if the symptoms worsen or if the condition fails to improve as anticipated.  I provided 39 minutes of non-face-to-face time during this encounter.   Maybel Dambrosio R Sharmila Wrobleski, LCSW   THERAPIST PROGRESS NOTE  Session Time: [phone]  Participation Level: Active  Behavioral Response: Neat and Well GroomedAlertDepressed  Type of Therapy: Individual Therapy  Treatment Goals addressed: Develop healthy thinking patterns and beliefs about self, others, and the world that lead to the alleviation and help prevent the relapse of depression per self report 3 out of 5 sessions documented   ProgressTowards Goals: Progressing  Interventions: CBT, Supportive, and Family Systems  Summary: NGAIRE GAMBLE is a 67 y.o. female who presents with improving  symptoms related to depression diagnosis. Pt reports that she is compliant with  medication and feels that her overall mood is stable. Pt reports that she is managing situational stressors well and is getting good quality and quantity of sleep.   Tearful:   Neighbors putting pressure on me about getting door fixed. Deadbolt defective.   Mom:  "she's not at the right place".   She has dementia. "She's in rehab" sits in there all day and tells [place] that we are doing her wrong.   Can't seem to get my own life and death on my mind.   Fun thing: Saturday was a good day.   Lock: you could stand outside the door and use the key.  Key wouldn't turn to lock it.  When I'm with the kids I'm happy.  DIL was going to shift to   Mother had the stroke--got closed out.  Concert end of August.   Sleep: sleeping good. Trouble falling asleep.    Brother power of attorney   1.  Clean rooms and car 2.  Continue visiting 3.  Self care 4. Relationship w/ sister      Continued recommendations are as follows: self care behaviors, positive social engagements, focusing on overall work/home/life balance, and focusing on positive physical and emotional wellness.   Suicidal/Homicidal: No  Therapist Response: Pt is continuing to apply interventions learned in session into daily life situations. Pt is currently on track to meet goals utilizing interventions mentioned above. Personal growth and progress noted. Treatment to continue as indicated.   Plan: Return again in 4 weeks.  Diagnosis:  No diagnosis found.   Collaboration of Care: Other pt to continue care with psychiatrist of record, Dr. Archer Asa.  Patient/Guardian was advised Release of Information must be obtained prior to any record release in order to collaborate their care with an outside provider. Patient/Guardian was advised if they have not already done so to contact the registration department to sign all necessary forms in order for [place] to release information regarding their care.  Consent: Patient/Guardian gives verbal consent for treatment and assignment of benefits for services provided during this visit. Patient/Guardian expressed understanding and agreed to proceed.   Ernest Haber Nalini Alcaraz, LCSW 04/18/2023

## 2023-05-03 ENCOUNTER — Ambulatory Visit (HOSPITAL_COMMUNITY): Payer: Medicare HMO | Admitting: Psychiatry

## 2023-07-05 ENCOUNTER — Ambulatory Visit (HOSPITAL_COMMUNITY): Payer: Medicare HMO | Admitting: Psychiatry

## 2023-07-11 ENCOUNTER — Ambulatory Visit (HOSPITAL_COMMUNITY): Payer: Medicare HMO | Admitting: Mental Health

## 2023-07-11 DIAGNOSIS — F324 Major depressive disorder, single episode, in partial remission: Secondary | ICD-10-CM

## 2023-07-11 DIAGNOSIS — F330 Major depressive disorder, recurrent, mild: Secondary | ICD-10-CM

## 2023-07-11 DIAGNOSIS — F411 Generalized anxiety disorder: Secondary | ICD-10-CM

## 2023-07-11 NOTE — Progress Notes (Signed)
Comprehensive Clinical Assessment (CCA) Note Virtual Visit via Video Note  I connected with Cassandra Lynch on 07/11/23 at  1:00 PM EDT by a video enabled telemedicine application and verified that I am speaking with the correct person using two identifiers.  Location: Patient: [name]-  Provider: home office    I discussed the limitations of evaluation and management by telemedicine and the availability of in person appointments. The patient expressed understanding and agreed to proceed.  I discussed the assessment and treatment plan with the patient. The patient was provided an opportunity to ask questions and all were answered. The patient agreed with the plan and demonstrated an understanding of the instructions.   The patient was advised to call back or seek an in-person evaluation if the symptoms worsen or if the condition fails to improve as anticipated.  I provided 50 minutes of non-face-to-face time during this encounter.   Cassandra Lynch, Cassandra Lynch   07/11/2023 Cassandra Lynch  Chief Complaint:  Chief Complaint  Patient presents with   Establish Care   Anxiety   Visit Diagnosis: Generalized Anxiety disorder, Major depression, mild recurrent    CCA Screening, Triage and Referral (STR)  Patient Reported Information How did you hear about [place]? Other (Comment) Cassandra Lynch)  Referral name: Cassandra Conrad do you see for routine medical problems? Primary Care  Practice/Facility [facility]  What Is the Reason for Your Visit/Call Today? "I have difficulty concentrating, difficulty loosing things. The main thing is I just almost cry at everything. Easy easy crier. When I wake up in the morning and I don't keep my Cassandra Lynch, I am just really really, I don't what to do with myself. My Cassandra Lynch is in school and my Cassandra Lynch is in pre-k. I am just really lost. I don't work every day now. The days when I don't see them and my son, when I don't see them. I am  just really sad."  How Long Has This Been Causing You Problems? > than 6 months  What Do You Feel Would Help You the Most Today? Treatment for Depression or other mood problem  Have You Recently Been in Any Inpatient Treatment (Hospital/Detox/Crisis Center/28-Day Program)? No  Have You Ever Received Services From [employer] Before? No  Have You Recently Had Any Thoughts About Hurting Yourself? No  Are You Planning to Commit Suicide/Harm Yourself At This time? No  Have you Recently Had Thoughts About Hurting Someone Cassandra Lynch? No  Have You Used Any Alcohol or Drugs in the Past 24 Hours? No  What Did You Use and How Much? NA  Do You Currently Have a Therapist/Psychiatrist? Yes  Name of Therapist/Psychiatrist: Plovsky Lynch - Medication managment  Have You Been Recently Discharged From Any Office Practice or Programs? No    CCA Screening Triage Referral Assessment Type of Contact: Tele-Assessment  Is this Initial or Reassessment? Initial Assessment  Collateral Involvement: None  Is CPS involved or ever been involved? Never  Is APS involved or ever been involved? Never  Patient Determined To Be At Risk for Harm To Self or Others Based on Review of Patient Reported Information or Presenting Complaint? No  Method: No Plan  Availability of Means: No access or NA  Intent: Vague intent or NA  Notification Required: No need or identified person  Additional Information for Danger to Others Potential: No data recorded Additional Comments for Danger to Others Potential: NA  Are There Guns or Other Weapons in Your Home? No  Types  of Guns/Weapons: denies  Who Could Verify You Are Able To Have These Secured: NA  Do You Have any Outstanding Charges, Pending Court Dates, Parole/Probation? denies  Contacted To Inform of Risk of Harm To Self or Others: No data recorded  Location of Assessment: [name] (address provided)  Does Patient Present under Involuntary  Commitment? No  [place] of Residence: Cassandra Lynch   Patient Currently Receiving the Following Services: Medication Management  Determination of Need: Routine (7 days)  Options For Referral: Medication Management; Outpatient Therapy     CCA Biopsychosocial Intake/Chief Complaint:  "I have difficulty concentrating, difficulty loosing things. The main thing is I just almost cry at everything. Easy easy crier. When I wake up in the morning and I don't keep my Cassandra Lynch, I am just really really, I don't what to do with myself. My Cassandra Lynch is in school and my Cassandra Lynch is in pre-k. I am just really lost. I don't work every day now. The days when I don't see them and my son, when I dont see them. I am just really sad."  Cassandra Lynch is a 67 year old divorced Caucasian female who presents for routine tele-assessment with [facility]. Shares history of being diagnosed with depression and anxiety. Shares to have been recieving medication and therapy services for the past x 2 years and reports to have had x 2 differing therapist since her presentation. Notes for most recent therapist to have left and in need of re-engaging with a new therapist. Continues to engage with medication managment  and followed by Cassandra. Cassandra and states to be medication complaint. Shares concerns for mental health dating back for the past x 28 years. Reports to have concerns with overly worrying about things and shares can become overly emotional and tearful. Shares to often worry about her family and shares can be bored and loney. Notes to have friendships however they are not as active as her. Notes can have frequent crying spells when son and grand-children are not around. Notes improvement with mood secondary to medications however continues to over worry and easily tearful.  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 Denies hx of suicide attempts or self-harm behaviors.)   Duration of Depressive symptoms:  Greater than two weeks   Mania:   None   Anxiety:    Worrying; Irritability; Tension; Difficulty concentrating (anxiety attacks. Shares can be claustrophic. Shares of being handcuffed.)   Psychosis:   None   Duration of Psychotic symptoms: No data recorded  Trauma:   Re-experience of traumatic event (flashbacks)   Obsessions:   None   Compulsions:   None   Inattention:   Disorganized; Loses things; Poor follow-through on tasks; Forgetful   Hyperactivity/Impulsivity:   Hard time playing/leisure activities quietly   Oppositional/Defiant Behaviors:   None   Emotional Irregularity:   None   Other Mood/Personality Symptoms:  No data recorded   Mental Status Exam Appearance and self-care  Stature:   Average   Weight:   Average weight   Clothing:   Casual   Grooming:   Well-groomed   Cosmetic use:   None   Posture/gait:  Normal   Motor activity:   Not Remarkable   Sensorium  Attention:   Normal   Concentration:   Normal   Orientation:   X5   Recall/memory:   Normal   Affect and Mood  Affect:   Appropriate   Mood:   Dysphoric; Anxious   Relating  Eye contact:   Normal   Facial expression:   Anxious; Sad   Attitude toward examiner:   Cooperative   Thought and Language  Speech flow:  Clear and Coherent   Thought content:   Appropriate to Mood and Circumstances   Preoccupation:   None   Hallucinations:   None   Organization:  No data recorded  [employer] of Knowledge:   Good   Intelligence:   Average   Abstraction:    Functional   Judgement:   Fair   Reality Testing:   Realistic   Insight:   Good   Decision Making:   Normal   Social Functioning  Social Maturity:   Responsible   Social Judgement:   Normal   Stress  Stressors:   Grief/losses; Family conflict (Mother passed away 06-23-2023- had dementia. Shares did not speak to sister for x 1 year related to mother's illness- shares to feel like she is walking on egg shells with the relationship)   Coping Ability:   Overwhelmed; Exhausted   Skill Deficits:   None   Supports:   Family; Friends/Service system     Religion: Religion/Spirituality Are You A Religious Person?: Yes What is Your Religious Affiliation?: Non-Denominational  Leisure/Recreation: Leisure / Recreation Do You Have Hobbies?: Yes Leisure and Hobbies: live music, travel , flowers  Exercise/Diet: Exercise/Diet Do You Exercise?: No Have You Gained or Lost A Significant Amount of Weight in the Past Six Months?: No Do You Follow a Special Diet?: No Do You Have Any Trouble Sleeping?: No   CCA Employment/Education Employment/Work Situation: Employment / Work Situation Employment 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?: No  Education: Education Is Patient Currently Attending School?: No Last Grade Completed: 12 Name of High School: - Did Garment/textile technologist From [employer]?: Yes Did Theme park manager?: Yes What Type of College Degree Do you Have?: Chief Operating Officer - Special Education Did [place] Attend Graduate School?: No What Was Your  Major?: Special Education- BA Did You Have Any Special Interests In School?: NA Did You Have An Individualized Education Program (IIEP): No Did You Have Any Difficulty At School?: Yes (notes to have struggled with math) Were Any Medications Ever Prescribed For These Difficulties?: No Patient's Education Has Been Impacted by Current Illness: No   CCA Family/Childhood History Family and Relationship History: Family history Marital status: Divorced Divorced, when?: 8 years What types of issues is patient dealing with in the relationship?: Married for 33 years- shares to have held on for the children. Additional relationship information: Denies interest in any addtional relationships. Notes for ex-husband to be remarried. Are you sexually active?: No What is your sexual orientation?: heterosexual Has your sexual activity been affected by drugs, alcohol, medication, or  emotional stress?: - Does patient have children?: Yes How many children?: 6 (son-61 years old ; daughter 76 years old) How is patient's relationship with their children?: Shares for relationship to be "perfect" 2 grand-children - 2 and 75 years of age.  Childhood History:  Childhood History By whom was/is the patient raised?: Both parents Additional childhood history information: Shares to have been raised by her parents, raised in [place], has lived in [place] since junior high school. Describes childhood as "good". Shares parents have been together since they were 49 years old. Mother recently passed 05/27/23. Description of patient's relationship with caregiver when they were a child: Mother: good   Father: good Patient's description of current relationship with people who raised him/her: Mother: deceased "It was good but she fustrated me to be around her because she was always worried." Mother had dementia  Father: "good." Shares relationshp has improved following mother passing with ability to spend time with him vs. always  spending time with mother. How were you disciplined when you got in trouble as a child/adolescent?: - Does patient have siblings?: Yes Number of Siblings: 2 (x 1 sister; x 1 brother) Description of patient's current relationship with siblings: Shares to have a good relationship with brother. Hx of discord with sister- shares she can try to tell her what to do. Shares sister can become upset and verbally assaultive towards her. Did patient suffer any verbal/emotional/physical/sexual abuse as a child?: No Did patient suffer from severe childhood neglect?: No Has patient ever been sexually abused/assaulted/raped as an adolescent or adult?: No Was the patient ever a victim of a crime or a disaster?: No Witnessed domestic violence?: No Has patient been affected by domestic violence as an adult?: No  Child/Adolescent Assessment:     CCA Substance Use Alcohol/Drug Use: Alcohol / Drug Use Prescriptions: See MAR History of alcohol / drug use?: Yes Substance #1 Name of Substance 1: Cigarettes 1 - Age of First Use: 14 1 - Amount (size/oz): pack a day 1 - Frequency: daily 1 - Duration: years 1 - Last Use / Amount: today 1 - Method of Aquiring: purchase 1- Route of Use: smoked Substance #2 Name of Substance 2: Alcohol 2 - Age of First Use: 18 2 - Amount (size/oz): 1 to 2 drinks in a sitting 2 - Frequency: couple times a month 2 - Duration: years 2 - Last Use / Amount: this past weekend. 2 - Method of Aquiring: purchase 2 - Route of Substance Use: drink                     ASAM's:  Six Dimensions of Multidimensional Assessment  Dimension 1:  Acute Intoxication and/or Withdrawal Potential:      Dimension 2:  Biomedical Conditions and Complications:      Dimension 3:  Emotional, Behavioral, or Cognitive Conditions and Complications:     Dimension 4:  Readiness to Change:     Dimension 5:  Relapse, Continued use, or Continued Problem Potential:     Dimension 6:   Recovery/Living Environment:     ASAM Severity Score:    ASAM Recommended Level of Treatment:     Substance use Disorder (SUD)    Recommendations for Services/Supports/Treatments: Recommendations for Services/Supports/Treatments Recommendations For Services/Supports/Treatments: Medication Management, Individual Therapy  DSM5 Diagnoses: Patient Active Problem List   Diagnosis Date Noted   Grief 01/20/2021   Generalized anxiety disorder 03/20/2020   Major depressive disorder with single episode, in partial remission (HCC) 03/20/2020  Smoker 02/02/2018   Personal history of colonic adenomas 08/27/2013   Summary:   Cassandra Lynch is a 67 year old divorced Caucasian female who presents for routine tele-assessment with [facility]. Shares history of being diagnosed with depression and anxiety. Shares to have been recieving medication and therapy services for the past x 2 years and reports to have had x 2 differing therapist since her presentation. Notes for most recent therapist to have left and in need of re-engaging with a new therapist. Continues to engage with medication managment and followed by Cassandra. Cassandra and states to be medication complaint. Shares concerns for mental health dating back for the past x 28 years. Reports to have concerns with overly worrying about things and shares can become overly emotional and tearful. Shares to often worry about her family and shares can be bored and loney. Notes to have friendships however they are not as active as her. Notes can have frequent crying spells when son and grand-children are not around. Notes improvement with mood secondary to medications however continues to over worry and easily tearful.   Cassandra Lynch presents for assessment alert and oriented; mood and affect low; dysphoric. Tearful at times. Speech clear and coherent at normal rate and tone. Engaged and cooperative to assessment. Shares concerns for mental health dating back to when her 60 year old  daughter was x 11 year old in which she sought treatment; noting concerns for depression at the time. Shares to feel as if she is more anxious than depressed latey and notes can over worry and over thinking things. Reports sxs of depression of low mood and crying spells. Denies concerns for appetite, sleep; denies isolation. Denies hx of self-harm or suicidal thoughts or behaviors. Shares concerns with anxiety with anxiety attacks occurring, excessive worry with difficulty controlling the worry and overthinking behaviors. Difficulty relaxing. Denies psychotic sxs. Notes presence of flashbacks of trauma, which should be further explored. Reports concerns for ADHD with difficulty with attention, disorganization, losing things and difficulty with completion of tasks. Shares use of alcohol approximately 2 to 4 times monthly of one to two drinks in a sitting and daily cigarette use. Denies legal concerns. 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 Reports adequate natural supports. Denies SI/HI/AVH. CSSRS, nutrition, GAD and PHQ completed.   PHQ: 10 GAD: 18  Patient Centered Plan: Patient is on the following Treatment Plan(s):  Anxiety and Depression   Referrals to Alternative Service(s): Referred to Alternative Service(s):   Place:   Date:   Time:    Referred to Alternative Service(s):   Place:   Date:   Time:    Referred to Alternative Service(s):   Place:   Date:   Time:    Referred to Alternative Service(s):   Place:   Date:   Time:      Collaboration of Care: Other None  Patient/Guardian was advised Release of Information must be obtained prior to any record release in order to collaborate their care with an outside provider. Patient/Guardian was advised if they have not already done so to contact the registration department to sign all necessary forms in order for [place] to release information regarding their care.   Consent: Patient/Guardian gives verbal  consent for treatment and assignment of benefits for services provided during this visit. Patient/Guardian expressed understanding and agreed to proceed.   Dorris Singh, [facility]

## 2023-07-12 ENCOUNTER — Ambulatory Visit (HOSPITAL_BASED_OUTPATIENT_CLINIC_OR_DEPARTMENT_OTHER): Payer: Medicare HMO | Admitting: Psychiatry

## 2023-07-12 ENCOUNTER — Encounter (HOSPITAL_COMMUNITY): Payer: Self-pay | Admitting: Psychiatry

## 2023-07-12 ENCOUNTER — Other Ambulatory Visit: Payer: Self-pay

## 2023-07-12 VITALS — BP 134/78 | HR 98 | Ht 64.5 in | Wt 151.0 lb

## 2023-07-12 DIAGNOSIS — F324 Major depressive disorder, single episode, in partial remission: Secondary | ICD-10-CM

## 2023-07-12 DIAGNOSIS — F32A Depression, unspecified: Secondary | ICD-10-CM | POA: Diagnosis not present

## 2023-07-12 DIAGNOSIS — F411 Generalized anxiety disorder: Secondary | ICD-10-CM

## 2023-07-12 MED ORDER — LAMOTRIGINE 200 MG PO TABS
200.0000 mg | ORAL_TABLET | Freq: Every day | ORAL | 3 refills | Status: DC
Start: 2023-07-12 — End: 2023-07-12

## 2023-07-12 MED ORDER — LAMOTRIGINE 200 MG PO TABS
200.0000 mg | ORAL_TABLET | Freq: Every day | ORAL | 3 refills | Status: DC
Start: 2023-07-12 — End: 2023-10-05

## 2023-07-12 MED ORDER — VENLAFAXINE HCL ER 75 MG PO CP24: 225.0000 mg | ORAL_CAPSULE | Freq: Every day | ORAL | 3 refills | Status: DC

## 2023-07-12 NOTE — Progress Notes (Signed)
Psychiatric Initial Adult Assessment   Patient Identification: Cassandra Lynch MRN:  [id number] Date of Evaluation:  07/12/2023 Referral Source: Cassandra Lynch Chief Complaint: Depression   Today the patient is doing okay.  She had a traumatic experience in the last 72 hours.  She was served with a subpoena and did not know what was from.  She found out this morning there is related to not paying her credit cards.  The patient had imagined that she would be arrested and put in handcuffs and was extremely upset.  She is now getting over this feeling of being upset and she believes that this will probably dealt with easily.  She owes $14,000 in credit card company and child to figure out a way to pay them back.  The other thing that happened thing that happened about a month or so ago was that her mother died.  Her mother was diagnosed with Alzheimer's and has been declining.  So she was beginning to fail it was evident that she was going to be dying.  The patient had difficulty time handling it but she is actually getting it through okay.  The patient is able to enjoy things.  She is able to enjoy her daughter Cassandra Lynch who is a singer or an actress and lives in [place].  She could not be going on toward her.  The patient also has plans to go to a [employer] consult with a new friend that she met.  Overall the patient is going to be fine.  She is going to continue taking her Abilify 2 mg her Effexor and her Lamictal.  The patient has started a new therapy and it seems to be going okay.   Visit Diagnosis:    ICD-10-CM   1. Generalized anxiety disorder  F41.1 venlafaxine XR (EFFEXOR-XR) 75 MG 24 hr capsule    2. Mild depression  F32.A venlafaxine XR (EFFEXOR-XR) 75 MG 24 hr capsule    lamoTRIgine (LAMICTAL) 200 MG tablet    DISCONTINUED: lamoTRIgine (LAMICTAL) 200 MG tablet      History of Present Illness: See above  Associated Signs/Symptoms: Depression Symptoms:  depressed mood, (Hypo) Manic Symptoms:    Anxiety Symptoms:   Psychotic Symptoms:   PTSD Symptoms: NA  Past Psychiatric History: Multiple psychotropic medicines and psychotherapy  Previous Psychotropic Medications: Yes   Substance Abuse History in the last 12 months:  No.  Consequences of Substance Abuse: NA  Past Medical History:  Past Medical History:  Diagnosis Date   Abnormal Pap smear of cervix 09/17/2015   LSIL CIN1 HR HPV:+detected   Allergy    Anxiety    Depression    Fibroids 05/2000   Personal history of colonic adenomas 08/27/2013   Smoker     Past Surgical History:  Procedure Laterality Date   CESAREAN SECTION     x 2   CHOLECYSTECTOMY, LAPAROSCOPIC  1989   COLONOSCOPY     FINGER SURGERY Right 2007   pointer, severed tendons   POLYPECTOMY      Family Psychiatric History:   Family History:  Family History  Problem Relation Age of Onset   Colon polyps Father    Colon polyps Brother    Heart disease Brother    Heart disease Mother    Colon cancer Neg Hx    Esophageal cancer Neg Hx    Rectal cancer Neg Hx    Stomach cancer Neg Hx     Social History:   Social History  Socioeconomic History   Marital status: Divorced    Spouse name: Not on file   Number of children: 2   Years of education: Not on file   Highest education level: Not on file  Occupational History   Not on file  Tobacco Use   Smoking status: Every Day    Current packs/day: 1.00    Average packs/day: 1 pack/day for 30.0 years (30.0 ttl pk-yrs)    Types: Cigarettes   Smokeless tobacco: Never  Vaping Use   Vaping status: Never Used  Substance and Sexual Activity   Alcohol use: Yes    Alcohol/week: 1.0 standard drink of alcohol    Types: 1 Glasses of wine per week    Comment: occasionally   Drug use: No   Sexual activity: Not Currently    Partners: Male    Birth control/protection: Post-menopausal  Other Topics Concern   Not on file  Social History Narrative   Not on file   Social Determinants of Health    Financial Resource Strain: Low Risk  (11/23/2022)   Received from [facility]   Overall Financial Resource Strain (CARDIA)    Difficulty of Paying Living Expenses: Not hard at all  Food Insecurity: No Food Insecurity (11/23/2022)   Received from [facility], [facility]   Hunger Vital Sign    Worried About Running Out of Food in the Last Year: Never true    Ran Out of Food in the Last Year: Never true  Transportation Needs: No Transportation Needs (11/23/2022)   Received from [facility], [facility]   PRAPARE - Transportation    Lack of Transportation (Medical): No    Lack of Transportation (Non-Medical): No  Physical Activity: Sufficiently Active (12/07/2021)   Received from [facility], [facility]   Exercise Vital Sign    Days of Exercise per Week: 6 days    Minutes of Exercise per Session: 50 min  Stress: No Stress Concern Present (12/07/2021)   Received from [facility], [facility] of Occupational Health - Occupational Stress Questionnaire    Feeling of Stress : Not at all  Social Connections: Socially Isolated (07/11/2023)   Social Connection and Isolation Panel [NHANES]    Frequency of Communication with Friends and Family: More than three times a week    Frequency of Social Gatherings with Friends and Family: Never    Attends Religious Services: Never    Database administrator or Organizations: No    Attends Banker Meetings: Never    Marital Status: Divorced    Additional Social History:   Allergies:   Allergies  Allergen Reactions   Biaxin [Clarithromycin] Hives   Ceftin [Cefuroxime Axetil]    Vancomycin     Metabolic Disorder Labs: Lab Results  Component Value Date   HGBA1C 5.4 02/05/2020   MPG 103 11/12/2016   No results found for: "PROLACTIN" Lab Results  Component Value Date   CHOL 187 02/05/2020   TRIG 145 02/05/2020   HDL 52 02/05/2020   CHOLHDL 3.6 02/05/2020   VLDL 27 11/12/2016    LDLCALC 109 (H) 02/05/2020   LDLCALC 162 (H) 11/12/2016   Lab Results  Component Value Date   TSH 3.650 02/05/2020    Therapeutic Level Labs: No results found for: "LITHIUM" No results found for: "CBMZ" No results found for: "VALPROATE"  Current Medications: Current Outpatient Medications  Medication Sig Dispense Refill   ARIPiprazole (ABILIFY) 2 MG tablet Take 1 tablet (  2 mg total) by mouth daily. 30 tablet 5   Desloratadine-Pseudoephedrine (CLARINEX-D 12 HOUR PO) Take 1 tablet by mouth 2 (two) times daily.     LORazepam (ATIVAN) 0.5 MG tablet 1 qhs  1 qday PRN 45 tablet 3   lamoTRIgine (LAMICTAL) 200 MG tablet Take 1 tablet (200 mg total) by mouth daily. 30 tablet 3   venlafaxine XR (EFFEXOR-XR) 75 MG 24 hr capsule Take 3 capsules (225 mg total) by mouth daily with breakfast. 90 capsule 3   Current Facility-Administered Medications  Medication Dose Route Frequency Provider Last Rate Last Admin   0.9 %  sodium chloride infusion  500 mL Intravenous Once Iva Boop, MD        Musculoskeletal: Strength & Muscle Tone: within normal limits Gait & Station: normal Patient leans: N/A  Psychiatric Specialty Exam: Review of Systems  Blood pressure 134/78, pulse 98, height 5' 4.5" (1.638 m), weight 151 lb (68.5 kg), last menstrual period 12/17/2007.Body mass index is 25.52 kg/m.  General Appearance: Casual  Eye Contact:  Good  Speech:  Clear and CoherentNormal  Volume:    Mood:  NA  Affect:  NA  Thought Process:  Goal Directed  Orientation:  NA  Thought Content:  WDL  Suicidal Thoughts:  No  Homicidal Thoughts:  No  Memory:  NA  Judgement:  Good  Insight:  Fair  Psychomotor Activity:  Normal  Concentration:    Recall:  Good  Fund of Knowledge:Good  Language: Good  Akathisia:  No  Handed:  Right  AIMS (if indicated):  not done  Assets:  Desire for Improvement  ADL's:  Intact  Cognition: WNL  Sleep:  Good   Screenings: GAD-7    Flowsheet Row Counselor from  07/11/2023 in [facility] Video Visit from 12/14/2021 in [facility] Video Visit from 09/24/2021 in [facility] Video Visit from 06/18/2021 in [facility] Video Visit from 01/20/2021 in [facility]  Total GAD-7 Score 18 13 18 9 14       PHQ2-9    Flowsheet Row Counselor from 07/11/2023 in [facility] Counselor from 12/28/2022 in [facility] at [facility] from 06/28/2022 in [facility] Health at [facility] Video Visit from 12/14/2021 in [facility] Video Visit from 09/24/2021 in [facility]  PHQ-2 Total Score 2 0 1 1 6   PHQ-9 Total Score 10 -- 8 4 13       Flowsheet Row Counselor from 07/11/2023 in [facility] Counselor from 03/07/2023 in [facility] [place] Counselor from 01/27/2023 in [facility] at [facility] RISK CATEGORY Low Risk No Risk No Risk       Assessment and Plan:    This patient's diagnosis is major clinical depression.  She will continue taking 2 mg of Abilify and continue taking Effexor.  She will take both of these medications in the morning.  At 9 she will take her Lamictal.  On her next visit we will reevaluate the purpose of the Lamictal.  The patient presently is in therapy.  I think the patient is fairly stable at this time.  She has been on and a lot of acute stressors recently but I think things are clearing.  She will return to see me in 3 months. Consent: Patient/Guardian gives verbal consent for  treatment and assignment of benefits for services provided during this visit. Patient/Guardian expressed understanding and agreed to proceed.   Gypsy Balsam, MD 9/24/20242:57 PM

## 2023-08-03 ENCOUNTER — Other Ambulatory Visit (HOSPITAL_COMMUNITY): Payer: Self-pay | Admitting: *Deleted

## 2023-08-03 MED ORDER — ARIPIPRAZOLE 2 MG PO TABS: 2.0000 mg | ORAL_TABLET | Freq: Every day | ORAL | 2 refills | Status: DC

## 2023-08-25 ENCOUNTER — Ambulatory Visit (HOSPITAL_COMMUNITY): Payer: Medicare HMO | Admitting: Mental Health

## 2023-08-25 ENCOUNTER — Encounter (HOSPITAL_COMMUNITY): Payer: Self-pay

## 2023-08-25 DIAGNOSIS — F32A Depression, unspecified: Secondary | ICD-10-CM | POA: Diagnosis not present

## 2023-08-25 DIAGNOSIS — F411 Generalized anxiety disorder: Secondary | ICD-10-CM

## 2023-08-25 DIAGNOSIS — F419 Anxiety disorder, unspecified: Secondary | ICD-10-CM | POA: Diagnosis not present

## 2023-08-25 NOTE — Progress Notes (Signed)
THERAPIST PROGRESS NOTE Virtual Visit via Video Note  I connected with Cassandra Lynch on 08/25/23 at 11:00 AM EST by a video enabled telemedicine application and verified that I am speaking with the correct person using two identifiers.  Location: Patient: home address on file Provider: office   I discussed the limitations of evaluation and management by telemedicine and the availability of in person appointments. The patient expressed understanding and agreed to proceed.  I discussed the assessment and treatment plan with the patient. The patient was provided an opportunity to ask questions and all were answered. The patient agreed with the plan and demonstrated an understanding of the instructions.   The patient was advised to call back or seek an in-person evaluation if the symptoms worsen or if the condition fails to improve as anticipated.  I provided 55 minutes of non-face-to-face time during this encounter.   Dorris Singh, [facility]   Session Time: 11:05 am ( 55 minutes)  Participation Level: Active  Behavioral Response: CasualAlertDepressed and Dysphoric  Type of Therapy: Individual Therapy  Treatment Goals addressed: STG: "Emotional wreck." Cassandra Lynch will increase management of anxious thoughts AEB development of x 3 coping skills with ability to reframe anxious/worrisome thoughts daily as needed within the next 90 days.   ProgressTowards Goals: Initial  Interventions: CBT and Supportive  Summary: Cassandra Lynch is a 67 y.o. female who presents with dx of major depression mild and generalized anxiety disorder. 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 Notes to feel as if she constantly worries about family, her son and her grand-children. Shares feelings of frustration and sadness of event of presenting to [employer] and becomes tearful sharing. Notes to cry all the time and difficulty with  controlling her tears. Shares to be easily upset," I know I am an emotional person." Notes for son to have had an heart concern several years ago in which he had to have surgery and becomes tearful sharing that. Notes to worry she did not do something right when seeing and caring for her grand-children or if she had to raise her voice and yell at them. Notes to cry when she leaves from them because she misses them. Shares thoughts on neighbor and if she is mad. " I think a lot of what ifs." Engaged with therapist and explores presence of maladaptive thinking and identified with black and white, jumping to conclusions and emotional reasoning. Explores with therapist working to increase ability to identify when she is engaging in distorted thoughts and 'hurting your own feelings.' Agrees to work to increase awareness of thinking patterns and ways in which they influence her feelings. Reviewed session and provided follow up. Initial work on treatment plan, sxs unchanged; no safety concerns reported.    Suicidal/Homicidal: Nowithout intent/plan  Therapist Response: Therapist engaged Noura in tele-therapy session. Completed check in and assessed for current level of functioning, sxs management nad level of stressors. Provided safe space to share thoughts and feelings in regards to stressors and concerns for high emotional reactivity. Explored thoughts on family and feelings of worry. Explored ability to remain present focused. Engaged in guided discovery and working to increase ability to process thoughts in balanced manner. Educated on presence of distortions in thoughts and working to reframe. Discussed concept of 'hurting her own feelings' with presence of thinking patterns. Reviewed cognitive distortions and ways in which it effects emotions.Reviewed session and provided follow  up.   Plan: Return again in  x8  weeks. Due to availabiilty  Diagnosis: Mild depression  Generalized anxiety  disorder  Collaboration of Care: Other None  Patient/Guardian was advised Release of Information must be obtained prior to any record release in order to collaborate their care with an outside provider. Patient/Guardian was advised if they have not already done so to contact the registration department to sign all necessary forms in order for [place] to release information regarding their care.   Consent: Patient/Guardian gives verbal consent for treatment and assignment of benefits for services provided during this visit. Patient/Guardian expressed understanding and agreed to proceed.   Stephan Minister Moriarty, [facility] 08/25/2023

## 2023-09-13 ENCOUNTER — Telehealth (HOSPITAL_COMMUNITY): Payer: Self-pay | Admitting: Mental Health

## 2023-09-13 NOTE — Telephone Encounter (Addendum)
Therapist returned call and contacted pt; no answer. Left HIPAA complaint message, will attempt to work pt in earlier with next cancellation on schedule.

## 2023-10-05 ENCOUNTER — Ambulatory Visit (HOSPITAL_COMMUNITY): Payer: Medicare HMO | Admitting: Psychiatry

## 2023-10-05 ENCOUNTER — Other Ambulatory Visit: Payer: Self-pay

## 2023-10-05 ENCOUNTER — Telehealth (HOSPITAL_COMMUNITY): Payer: Self-pay | Admitting: Mental Health

## 2023-10-05 VITALS — BP 121/83 | HR 92 | Ht 64.5 in | Wt 153.0 lb

## 2023-10-05 DIAGNOSIS — F3342 Major depressive disorder, recurrent, in full remission: Secondary | ICD-10-CM

## 2023-10-05 DIAGNOSIS — F411 Generalized anxiety disorder: Secondary | ICD-10-CM

## 2023-10-05 DIAGNOSIS — F32A Depression, unspecified: Secondary | ICD-10-CM

## 2023-10-05 MED ORDER — VENLAFAXINE HCL ER 75 MG PO CP24
225.0000 mg | ORAL_CAPSULE | Freq: Every day | ORAL | 5 refills | Status: DC
Start: 2023-10-05 — End: 2024-02-07

## 2023-10-05 MED ORDER — ARIPIPRAZOLE 2 MG PO TABS: 2.0000 mg | ORAL_TABLET | Freq: Every day | ORAL | 4 refills | Status: DC

## 2023-10-05 MED ORDER — LAMOTRIGINE 100 MG PO TABS
200.0000 mg | ORAL_TABLET | Freq: Every day | ORAL | 5 refills | Status: DC
Start: 2023-10-05 — End: 2024-02-07

## 2023-10-05 NOTE — Progress Notes (Signed)
Psychiatric Initial Adult Assessment   Patient Identification: Cassandra Lynch MRN:  [id number] Date of Evaluation:  10/05/2023 Referral Source: Doyne Keel Chief Complaint: Depression   Today the patient is doing much better.  It turns out the issue with her credit cards was insignificant.  The patient is doing very well.  She has 2 adult children 1 whose name is [name] who is a singular and travels around the show all over the country.  She is a Equities trader.  Her other son's name is Clint lives 30 minutes away and has 2 children or grandchildren ages 38 and 70.  He is doing great.  She is going to see both of her children during Christmas.  The patient's mood is good.  She is sleeping and eating well.  She has got good energy.  Over the last year or 2 attempts to reduce her Abilify have left her in a state of distress and depression.  She shows no signs of tardive dyskinesia.  She clearly can see that Abilify 2 mg is very beneficial.  She continues taking Effexor at well at full dose.  Today we will going to go ahead and reduce her Lamictal and attempt probably to taper her off of it.  The patient also continues in therapy.  The patient is doing well.  She is functioning well.  She lives alone.   Visit Diagnosis:    ICD-10-CM   1. Generalized anxiety disorder  F41.1 venlafaxine XR (EFFEXOR-XR) 75 MG 24 hr capsule    2. Mild depression  F32.A venlafaxine XR (EFFEXOR-XR) 75 MG 24 hr capsule    lamoTRIgine (LAMICTAL) 100 MG tablet      History of Present Illness: See above  Associated Signs/Symptoms: Depression Symptoms:  depressed mood, (Hypo) Manic Symptoms:   Anxiety Symptoms:   Psychotic Symptoms:   PTSD Symptoms: NA  Past Psychiatric History: Multiple psychotropic medicines and psychotherapy  Previous Psychotropic Medications: Yes   Substance Abuse History in the last 12 months:  No.  Consequences of Substance Abuse: NA  Past Medical History:  Past Medical History:   Diagnosis Date   Abnormal Pap smear of cervix 09/17/2015   LSIL CIN1 HR HPV:+detected   Allergy    Anxiety    Depression    Fibroids 05/2000   Personal history of colonic adenomas 08/27/2013   Smoker     Past Surgical History:  Procedure Laterality Date   CESAREAN SECTION     x 2   CHOLECYSTECTOMY, LAPAROSCOPIC  1989   COLONOSCOPY     FINGER SURGERY Right 2007   pointer, severed tendons   POLYPECTOMY      Family Psychiatric History:   Family History:  Family History  Problem Relation Age of Onset   Colon polyps Father    Colon polyps Brother    Heart disease Brother    Heart disease Mother    Colon cancer Neg Hx    Esophageal cancer Neg Hx    Rectal cancer Neg Hx    Stomach cancer Neg Hx     Social History:   Social History   Socioeconomic History   Marital status: Divorced    Spouse name: Not on file   Number of children: 2   Years of education: Not on file   Highest education level: Not on file  Occupational History   Not on file  Tobacco Use   Smoking status: Every Day    Current packs/day: 1.00    Average packs/day: 1 pack/day  for 30.0 years (30.0 ttl pk-yrs)    Types: Cigarettes   Smokeless tobacco: Never  Vaping Use   Vaping status: Never Used  Substance and Sexual Activity   Alcohol use: Yes    Alcohol/week: 1.0 standard drink of alcohol    Types: 1 Glasses of wine per week    Comment: occasionally   Drug use: No   Sexual activity: Not Currently    Partners: Male    Birth control/protection: Post-menopausal  Other Topics Concern   Not on file  Social History Narrative   Not on file   Social Drivers of Health   Financial Resource Strain: Low Risk  (11/23/2022)   Received [facility]   Overall Financial Resource Strain (CARDIA)    Difficulty of Paying Living Expenses: Not hard at all  Food Insecurity: No Food Insecurity (11/23/2022)   Received from [facility]   Hunger Vital Sign    Worried About  Running Out of Food in the Last Year: Never true    Ran Out of Food in the Last Year: Never true  Transportation Needs: No Transportation Needs (11/23/2022)   [facility] from [facility], [facility]   PRAPARE - Transportation    Lack of Transportation (Medical): No    Lack of Transportation (Non-Medical): No  Physical Activity: Sufficiently Active (12/07/2021)   Received from [facility], [facility]   Exercise Vital Sign    Days of Exercise per Week: 6 days    Minutes of Exercise per Session: 50 min  Stress: No Stress Concern Present (12/07/2021)   [facility] from [facility] of Occupational Health - Occupational Stress Questionnaire    Feeling of Stress : Not at all  Social Connections: Socially Isolated (07/11/2023)   Social Connection and Isolation Panel [NHANES]    Frequency of Communication with Friends and Family: More than three times a week    Frequency of Social Gatherings with Friends and Family: Never    Attends Religious Services: Never    Database administrator or Organizations: No    Attends Banker Meetings: Never    Marital Status: Divorced    Additional Social History:   Allergies:   Allergies  Allergen Reactions   Biaxin [Clarithromycin] Hives   Ceftin [Cefuroxime Axetil]    Vancomycin     Metabolic Disorder Labs: Lab Results  Component Value Date   HGBA1C 5.4 02/05/2020   MPG 103 11/12/2016   No results found for: "PROLACTIN" Lab Results  Component Value Date   CHOL 187 02/05/2020   TRIG 145 02/05/2020   HDL 52 02/05/2020   CHOLHDL 3.6 02/05/2020   VLDL 27 11/12/2016   LDLCALC 109 (H) 02/05/2020   LDLCALC 162 (H) 11/12/2016   Lab Results  Component Value Date   TSH 3.650 02/05/2020    Therapeutic Level Labs: No results found for: "LITHIUM" No results found for: "CBMZ" No results found for: "VALPROATE"  Current Medications: Current Outpatient Medications  Medication Sig Dispense  Refill   ARIPiprazole (ABILIFY) 2 MG tablet Take 1 tablet (2 mg total) by mouth daily. 30 tablet 4   Desloratadine-Pseudoephedrine (CLARINEX-D 12 HOUR PO) Take 1 tablet by mouth 2 (two) times daily.     lamoTRIgine (LAMICTAL) 100 MG tablet Take 2 tablets (200 mg total) by mouth daily. 60 tablet 5   LORazepam (ATIVAN) 0.5 MG tablet 1 qhs  1 qday PRN 45 tablet 3   venlafaxine XR (EFFEXOR-XR)  75 MG 24 hr capsule Take 3 capsules (225 mg total) by mouth daily with breakfast. 90 capsule 5   Current Facility-Administered Medications  Medication Dose Route Frequency Provider Last Rate Last Admin   0.9 %  sodium chloride infusion  500 mL Intravenous Once Iva Boop, MD        Musculoskeletal: Strength & Muscle Tone: within normal limits Gait & Station: normal Patient leans: N/A  Psychiatric Specialty Exam: Review of Systems  Blood pressure 121/83, pulse 92, height 5' 4.5" (1.638 m), weight 153 lb (69.4 kg), last menstrual period 12/17/2007.Body mass index is 25.86 kg/m.  General Appearance: Casual  Eye Contact:  Good  Speech:  Clear and CoherentNormal  Volume:    Mood:  NA  Affect:  NA  Thought Process:  Goal Directed  Orientation:  NA  Thought Content:  WDL  Suicidal Thoughts:  No  Homicidal Thoughts:  No  Memory:  NA  Judgement:  Good  Insight:  Fair  Psychomotor Activity:  Normal  Concentration:    Recall:  Good  Fund of Knowledge:Good  Language: Good  Akathisia:  No  Handed:  Right  AIMS (if indicated):  not done  Assets:  Desire for Improvement  ADL's:  Intact  Cognition: WNL  Sleep:  Good   Screenings: GAD-7    Flowsheet Row Counselor from 07/11/2023 in [facility] Video Visit from 12/14/2021 in [facility] Video Visit from 09/24/2021 in [facility] Video Visit from 06/18/2021 in [facility] Video Visit from 01/20/2021 in [facility]  Total GAD-7 Score 18 13 18 9 14       PHQ2-9    Flowsheet Row Counselor from 07/11/2023 in [facility] Counselor from 12/28/2022 in [facility] at [facility] from 06/28/2022 in [facility] Out[facility] at [facility] Video Visit from 12/14/2021 in [facility] Video Visit from 09/24/2021 in [facility]  PHQ-2 Total Score 2 0 1 1 6   PHQ-9 Total Score 10 -- 8 4 13       Flowsheet Row Counselor from 07/11/2023 in [facility] Counselor from 03/07/2023 in [facility] at [place] Counselor from 01/27/2023 in [facility] Outpatient Behavior[facility] Health at [facility] RISK CATEGORY Low Risk No Risk No Risk       Assessment and Plan:    This patient's diagnosis is major depression.  She takes Effexor and Abilify for this condition.  She also probably was taking Lamictal.  Today we will go to reduce her Lamictal down to 100 mg and after 1 month she will attempt to stop it.  She will return to see me in 4 months.  She will continue in one-to-one therapy.  She is a Industrial/product designer. Consent: Patient/Guardian gives verbal consent for treatment and assignment of benefits for services provided during this visit. Patient/Guardian expressed understanding and agreed to proceed.   Gypsy Balsam, MD 12/18/20243:57 PM

## 2023-10-05 NOTE — Telephone Encounter (Signed)
No contact.

## 2023-10-27 ENCOUNTER — Ambulatory Visit (HOSPITAL_COMMUNITY): Payer: Medicare HMO | Admitting: Mental Health

## 2023-10-27 DIAGNOSIS — F32A Depression, unspecified: Secondary | ICD-10-CM

## 2023-10-27 DIAGNOSIS — F411 Generalized anxiety disorder: Secondary | ICD-10-CM

## 2023-10-27 NOTE — Progress Notes (Signed)
 THERAPIST PROGRESS NOTE Virtual Visit via Video Note  I connected with Cassandra Lynch on 10/27/23 at  4:00 PM EST by a video enabled telemedicine application and verified that I am speaking with the correct person using two identifiers.  Location: Patient: Cassandra Lynch.  Provider: office   I discussed the limitations of evaluation and management by telemedicine and the availability of in person appointments. The patient expressed understanding and agreed to proceed.  I discussed the assessment and treatment plan with the patient. The patient was provided an opportunity to ask questions and all were answered. The patient agreed with the plan and demonstrated an understanding of the instructions.   The patient was advised to call back or seek an in-person evaluation if the symptoms worsen or if the condition fails to improve as anticipated.  I provided 48 minutes of non-face-to-face time during this encounter.   Ty Bernice Savant, [facility]   Session Time: 4:07 am ( 48 minutes)  Participation Level: Active  Behavioral Response: NeatAlertDysphoric  Type of Therapy: Individual Therapy  Treatment Goals addressed:  STG: Emotional wreck. Cassandra Lynch will increase management of anxious thoughts AEB development of x 3 coping skills with ability to reframe anxious/worrisome thoughts daily as needed within the next 90 days.   ProgressTowards Goals: Progressing  Interventions: Supportive  Summary: Cassandra Lynch is a 68 y.o. female who presents with dx of major depression mild and generalized anxiety disorder. Presents with chief complaint of high degree of crying spells and shares feeling as if she is overly emotional emotional wreck Shares events of holidays and first Christmas holiday without her mother. Shares thoughts of crying excessively at times and shares episode of when son had to pull her to the side due to frequency of crying. Shares thoughts on her grand-children and thoughts of 39 year  old grand-son and thoughts on his interest. Shares thoughts of stress related to sister and her having bipolar disorder. Becomes tearful of thoughts of son almost passing way of a surgery in which he had several years ago. Able to engage with therapist and regulate emotions and process working to remain in the present. Engaged with therapist in working to gauge level of emotional response based on current events. Shares thought son book Let them and ways in which she feels this can support her in managing interactions with others and managing emotions Notes desire and plan to present to [place] and fondness of traveling. Shares plans to spend time with friends prior to returning home. Working towards progress with goals. Denies safety concerns.   Suicidal/Homicidal: Nowithout intent/plan  Therapist Response: Therapist engaged Cassandra Lynch in tele-therapy session. Completed check in and assessed for current level of functioning, sxs management and level of stressors. Provided safe space to share thoughts and feelings and provided safe space to emote. Explored current stressors and provided feedback. Explored ability to remain present focused and not allowing catastrophizing or future telling thoughts to increase emotional responses. Explored levels of severity and emotions while normalizing appropriate emotional responses. Encouraged working to engage in and enjoy life and maintaining boundaries with self. Reviewed session and provided follow up.   Plan: Return again in x 4 weeks.  Diagnosis: Mild depression  Generalized anxiety disorder  Collaboration of Care: Other None  Patient/Guardian was advised Release of Information must be obtained prior to any record release in order to collaborate their care with an outside provider. Patient/Guardian was advised if they have not already done so to contact the registration department  to sign all necessary forms in order for us  to release information regarding their  care.   Consent: Patient/Guardian gives verbal consent for treatment and assignment of benefits for services provided during this visit. Patient/Guardian expressed understanding and agreed to proceed.   Ty Asal [place], [facility] 10/27/2023

## 2023-11-10 ENCOUNTER — Ambulatory Visit (HOSPITAL_COMMUNITY): Payer: Medicare HMO | Admitting: Mental Health

## 2023-11-10 DIAGNOSIS — F411 Generalized anxiety disorder: Secondary | ICD-10-CM

## 2023-11-10 DIAGNOSIS — F32A Depression, unspecified: Secondary | ICD-10-CM

## 2023-11-10 NOTE — Progress Notes (Signed)
THERAPIST PROGRESS NOTE Virtual Visit via Video Note  I connected with Cassandra Lynch on 11/10/23 at  3:00 PM EST by a video enabled telemedicine application and verified that I am speaking with the correct person using two identifiers.  Location: Patient: home address on file Provider: office   I discussed the limitations of evaluation and management by telemedicine and the availability of in person appointments. The patient expressed understanding and agreed to proceed.  I discussed the assessment and treatment plan with the patient. The patient was provided an opportunity to ask questions and all were answered. The patient agreed with the plan and demonstrated an understanding of the instructions.   The patient was advised to call back or seek an in-person evaluation if the symptoms worsen or if the condition fails to improve as anticipated.  I provided 50 minutes of non-face-to-face time during this encounter.   Dorris Singh, [facility]   Session Time: 3:10 am ( 50  Participation Level: Active  Behavioral Response: CasualAlertEuthymic  Type of Therapy: Individual Therapy  Treatment Goals addressed: STG: "Emotional wreck." [name] will increase management of anxious thoughts AEB development of x 3 coping skills with ability to reframe anxious/worrisome thoughts daily as needed within the next 90 days.   ProgressTowards Goals: Progressing  Interventions: CBT and Supportive  Summary: Cassandra Lynch is a 68 y.o. female who presents with dx of major depression mild and generalized anxiety disorder. Presents with chief complaint of high degree of crying spells and shares feeling as if she is overly emotional; shares currently for moods to be more stable. Notes feelings of frustration related to [facility] where she resides and shares concern for having to remove her outside covered/roofed patio. Notes for this to be her favorite place of her home and where she spends large amount of  time. Shares to have attended meeting with son and to be awaiting results of vote. Notes possible decision in selling her home if she has to remove covered patio. Shares additional stressor of interacting with sister who she shares has fluctuating moods and explored with therapist to put in place boundaries with sister and reports for brother to have chosen to cease contact with sister as a result of her irritable outburst. Shares current state of home and shares to have a high degree of items in which she needs to sort, organize and shares to be overwhelmed with house. Identifies with therapist area in home to start with and focus on that one area in that one room. Denies safety concerns. Progress with goals with decrease sxs of depression reported; ongoing work towards management of anxiety.  Suicidal/Homicidal: Nowithout intent/plan  Therapist Response: Therapist engaged Sachi in tele-therapy session. Completed check in and assessed for current level of functioning, sxs management and level of stressors. Provided safe space to share thoughts and feelings in regards to current stressor with home and supported in identifying options. Engaged in guided discovery exploring processing interactions with sisters and ability to put in place boundaries with sister. Explored worry of state of home and supported in identifying manageable tasks to support in completion of needed cleaning around home. Reviewed ability to engage in self and management of anxious thoughts. Reviewed session and provided follow up. No safety concerns reported.   Plan: Return again in  x 4 weeks.  Diagnosis: Mild depression  Generalized anxiety disorder  Collaboration of Care: Other None  Patient/Guardian was advised Release of Information must be obtained prior to any record release  in order to collaborate their care with an outside provider. Patient/Guardian was advised if they have not already done so to contact the registration  department to sign all necessary forms in order for [place] to release information regarding their care.   Consent: Patient/Guardian gives verbal consent for treatment and assignment of benefits for services provided during this visit. Patient/Guardian expressed understanding and agreed to proceed.   Stephan Minister [place], [facility] 11/10/2023

## 2023-12-19 ENCOUNTER — Ambulatory Visit (HOSPITAL_COMMUNITY): Payer: Medicare HMO | Admitting: Mental Health

## 2023-12-19 ENCOUNTER — Encounter (HOSPITAL_COMMUNITY): Payer: Self-pay

## 2023-12-23 ENCOUNTER — Ambulatory Visit (HOSPITAL_COMMUNITY): Admitting: Mental Health

## 2023-12-23 DIAGNOSIS — F32A Depression, unspecified: Secondary | ICD-10-CM | POA: Diagnosis not present

## 2023-12-23 DIAGNOSIS — F411 Generalized anxiety disorder: Secondary | ICD-10-CM

## 2023-12-23 NOTE — Progress Notes (Signed)
   THERAPIST PROGRESS NOTE Virtual Visit via Video Note  I connected with SAXON CROSBY on 12/23/23 at  9:00 AM EST by a video enabled telemedicine application and verified that I am speaking with the correct person using two identifiers.  Location: Patient: home address on file Provider: home office   I discussed the limitations of evaluation and management by telemedicine and the availability of in person appointments. The patient expressed understanding and agreed to proceed.  I discussed the assessment and treatment plan with the patient. The patient was provided an opportunity to ask questions and all were answered. The patient agreed with the plan and demonstrated an understanding of the instructions.   The patient was advised to call back or seek an in-person evaluation if the symptoms worsen or if the condition fails to improve as anticipated.  I provided 50 minutes of non-face-to-face time during this encounter.   Dorris Singh, [facility]   Session Time: 9:00 am ( 50 minutes)   Participation Level: Active  Behavioral Response: CasualAlertDepressed and Dysphoric  Type of Therapy: Individual Therapy  Treatment Goals addressed: STG: "Emotional wreck." Zala will increase management of anxious thoughts AEB development of x 3 coping skills with ability to reframe anxious/worrisome thoughts daily as needed within the next 90 days.   ProgressTowards Goals: Progressing  Interventions: CBT and Supportive  Summary: CHISTINE DEMATTEO is a 68 y.o. female who presents with dx of major depression mild and generalized anxiety disorder. Presents with chief complaint of feelings of guilt. Shares thoughts of mistakes she has made in the past that she continues to ruminate about. Denies to have taken medications thus far and agrees to go take medications during session. Engages in processing feelings of guilt in balance manner and working to give self grace and normalizing mistakes. Shares  ongoing frustrations with situation with porch and for HOA to have ruled against her ability to keep her screened in porch. Shares is exploring her options. Shares in regard to relationship with sister and notes to have been able to increase organization of home with sister's support. Ongoing work no goals to reframe anxious worrisome thoughts. Denies safety concerns.  Suicidal/Homicidal: Nowithout intent/plan  Therapist Response: Therapist engaged Camiah in tele-therapy session. Completed check in and assessed for current level of functioning, sxs management and level of stressors. Provided safe space to share thoughts and feelings in regards to current stressor with home and supported in identifying options. Engaged in challenging distorted thoughts of feelings of extreme guilt and normalized mistakes. Explored engagement in activities in which she enjoys and working to give self grace and enjoying daily life. Reviewed session and provided follow up.  Plan: Return again in  x 6 weeks.  Diagnosis: Generalized anxiety disorder  Mild depression  Collaboration of Care: Other None  Patient/Guardian was advised Release of Information must be obtained prior to any record release in order to collaborate their care with an outside provider. Patient/Guardian was advised if they have not already done so to contact the registration department to sign all necessary forms in order for [place] to release information regarding their care.   Consent: Patient/Guardian gives verbal consent for treatment and assignment of benefits for services provided during this visit. Patient/Guardian expressed understanding and agreed to proceed.   Stephan Minister [place], [facility] 12/23/2023

## 2024-02-07 ENCOUNTER — Ambulatory Visit (INDEPENDENT_AMBULATORY_CARE_PROVIDER_SITE_OTHER): Admitting: Mental Health

## 2024-02-07 ENCOUNTER — Other Ambulatory Visit: Payer: Self-pay

## 2024-02-07 ENCOUNTER — Encounter (HOSPITAL_COMMUNITY): Payer: Self-pay | Admitting: Psychiatry

## 2024-02-07 ENCOUNTER — Ambulatory Visit (HOSPITAL_COMMUNITY): Payer: Medicare HMO | Admitting: Psychiatry

## 2024-02-07 VITALS — BP 142/78 | HR 92 | Ht 64.0 in | Wt 152.0 lb

## 2024-02-07 DIAGNOSIS — F32A Depression, unspecified: Secondary | ICD-10-CM

## 2024-02-07 DIAGNOSIS — F411 Generalized anxiety disorder: Secondary | ICD-10-CM

## 2024-02-07 DIAGNOSIS — F3341 Major depressive disorder, recurrent, in partial remission: Secondary | ICD-10-CM

## 2024-02-07 MED ORDER — ARIPIPRAZOLE 2 MG PO TABS: 2.0000 mg | ORAL_TABLET | Freq: Every day | ORAL | 4 refills | Status: DC

## 2024-02-07 MED ORDER — LAMOTRIGINE 25 MG PO TABS
ORAL_TABLET | ORAL | 0 refills | Status: DC
Start: 2024-02-07 — End: 2024-04-18

## 2024-02-07 MED ORDER — VENLAFAXINE HCL ER 150 MG PO CP24: 300.0000 mg | ORAL_CAPSULE | Freq: Every day | ORAL | 3 refills | Status: DC

## 2024-02-07 NOTE — Progress Notes (Unsigned)
   THERAPIST PROGRESS NOTE  Session Time: 4:00 pm( 50 minutes)  Participation Level: Active  Behavioral Response: CasualAlertWNL  Type of Therapy: Individual Therapy  Treatment Goals addressed:  STG: "Emotional wreck." Lety will increase management of anxious thoughts AEB development of x 3 coping skills with ability to reframe anxious/worrisome thoughts daily as needed within the next 90 days.   ProgressTowards Goals: Progressing  Interventions: CBT and Supportive  Summary:  Cassandra Lynch is a 68 y.o. female who presents with dx of major depression mild and generalized anxiety disorder. Presents with chief complaint of difficulty interacting with sister and stress of having to remove her back covered deck. Shares initial thoughts of not having anyone to help her put up a tent in which she bought as a an option to have a covered deck but able to explore with therapist ability to have son support and is able to work through thoughts of not wanting to bother or burden others. Shares thoughts on desire to move and find self land and is no longer wants to reside in current neighborhood. Shares events of beach trip with sister with sister yelling at her and having to do what sister wants and how she wants. Able to explore assertive communication and setting boundaries with sister. Shares some feelings of hesitation in regards to working to increase boundaries with sister as she can be very verbally aggressive. Notes to continue to still feel as if she is highly emotional/tearful easily and shares for medication adjustments have been made. Denies safety concerns.  Suicidal/Homicidal: Nowithout intent/plan  Therapist Response:  Therapist engaged [name] in tele-therapy session. Completed check in and assessed for current level of functioning, sxs management and level of stressors. Provided safe space to share thoughts and feelings in regards to current stressors of interaction with sister and home.  Supported in exploring options for covered area in her back and choices she may have. Encouraged asking from support from son and discouraged mind reading and jumping to conclusions cognitions. Educated on working to incorporate boundaires with sister and ability to assert her self with sister as needed. Explored barriers and supported in development of plan to increase assertive behavior. Reviewed session and provided follow up.   Plan: Return again in  x 6 weeks.  Diagnosis: Generalized anxiety disorder  Mild depression  Collaboration of Care: Other None  Patient/Guardian was advised Release of Information must be obtained prior to any record release in order to collaborate their care with an outside provider. Patient/Guardian was advised if they have not already done so to contact the registration department to sign all necessary forms in order for us  to release information regarding their care.   Consent: Patient/Guardian gives verbal consent for treatment and assignment of benefits for services provided during this visit. Patient/Guardian expressed understanding and agreed to proceed.   Carmel [place], [facility] 02/07/2024

## 2024-02-07 NOTE — Progress Notes (Signed)
 Psychiatric Initial Adult Assessment   Patient Identification: Cassandra Lynch MRN:  [id number] Date of Evaluation:  02/07/2024 Referral Source: Melisa Spray Chief Complaint: Depression   Today the patient is not at her baseline.  She feels like she worries a lot.  Importantly she says she cries every day.  She describes significant anxiety and worry about lots of different things.  She finds herself very irritable.  She feels like she is going to blow up.  She is upset with the HOA where she lives.  The patient works by taking care of her 2 grandchildren ages 33 and 36.  The patient finds her self very bothered by her dog and her cat and things are getting on her nerves easily.  Patient denies use of alcohol or drugs.  Overall though her health is actually fairly good.  Financially she is stable.  The patient continues in one-to-one therapy.   Visit Diagnosis:    ICD-10-CM   1. Mild depression  F32.A lamoTRIgine  (LAMICTAL ) 25 MG tablet    venlafaxine  XR (EFFEXOR -XR) 150 MG 24 hr capsule    2. Generalized anxiety disorder  F41.1 venlafaxine  XR (EFFEXOR -XR) 150 MG 24 hr capsule      History of Present Illness: See above  Associated Signs/Symptoms: Depression Symptoms:  depressed mood, (Hypo) Manic Symptoms:   Anxiety Symptoms:   Psychotic Symptoms:   PTSD Symptoms: NA  Past Psychiatric History: Multiple psychotropic medicines and psychotherapy  Previous Psychotropic Medications: Yes   Substance Abuse History in the last 12 months:  No.  Consequences of Substance Abuse: NA  Past Medical History:  Past Medical History:  Diagnosis Date   Abnormal Pap smear of cervix 09/17/2015   LSIL CIN1 HR HPV:+detected   Allergy    Anxiety    Depression    Fibroids 05/2000   Personal history of colonic adenomas 08/27/2013   Smoker     Past Surgical History:  Procedure Laterality Date   CESAREAN SECTION     x 2   CHOLECYSTECTOMY, LAPAROSCOPIC  1989   COLONOSCOPY     FINGER SURGERY Right  2007   pointer, severed tendons   POLYPECTOMY      Family Psychiatric History:   Family History:  Family History  Problem Relation Age of Onset   Colon polyps Father    Colon polyps Brother    Heart disease Brother    Heart disease Mother    Colon cancer Neg Hx    Esophageal cancer Neg Hx    Rectal cancer Neg Hx    Stomach cancer Neg Hx     Social History:   Social History   Socioeconomic History   Marital status: Divorced    Spouse name: Not on file   Number of children: 2   Years of education: Not on file   Highest education level: Not on file  Occupational History   Not on file  Tobacco Use   Smoking status: Every Day    Current packs/day: 1.00    Average packs/day: 1 pack/day for 30.0 years (30.0 ttl pk-yrs)    Types: Cigarettes   Smokeless tobacco: Never  Vaping Use   Vaping status: Never Used  Substance and Sexual Activity   Alcohol use: Yes    Alcohol/week: 1.0 standard drink of alcohol    Types: 1 Glasses of wine per week    Comment: occasionally   Drug use: No   Sexual activity: Not Currently    Partners: Male    Birth control/protection:  Post-menopausal  Other Topics Concern   Not on file  Social History Narrative   Not on file   Social Drivers of Health   Financial Resource Strain: Low Risk  (01/25/2024)   Received from [facility]   Overall Financial Resource Strain (CARDIA)    Difficulty of Paying Living Expenses: Not hard at all  Food Insecurity: No Food Insecurity (01/25/2024)   Received from [facility]   Hunger Vital Sign    Worried About Running Out of Food in the Last Year: Never true    Ran Out of Food in the Last Year: Never true  Transportation Needs: No Transportation Needs (01/25/2024)   Received from [facility] - Transportation    Lack of Transportation (Medical): No    Lack of Transportation (Non-Medical): No  Physical Activity: Sufficiently Active (01/25/2024)   Received from [facility]   Exercise Vital Sign     Days of Exercise per Week: 6 days    Minutes of Exercise per Session: 50 min  Stress: No Stress Concern Present (01/25/2024)   Received from [facility] - Occupational Stress Questionnaire    Feeling of Stress : Not at all  Social Connections: Moderately Integrated (01/25/2024)   Received from [facility]   Social Network    How would you rate your social network (family, work, friends)?: Adequate participation with social networks    Additional Social History:   Allergies:   Allergies  Allergen Reactions   Biaxin [Clarithromycin] Hives   Ceftin [Cefuroxime Axetil]    Vancomycin     Metabolic Disorder Labs: Lab Results  Component Value Date   HGBA1C 5.4 02/05/2020   MPG 103 11/12/2016   No results found for: "PROLACTIN" Lab Results  Component Value Date   CHOL 187 02/05/2020   TRIG 145 02/05/2020   HDL 52 02/05/2020   CHOLHDL 3.6 02/05/2020   VLDL 27 11/12/2016   LDLCALC 109 (H) 02/05/2020   LDLCALC 162 (H) 11/12/2016   Lab Results  Component Value Date   TSH 3.650 02/05/2020    Therapeutic Level Labs: No results found for: "LITHIUM" No results found for: "CBMZ" No results found for: "VALPROATE"  Current Medications: Current Outpatient Medications  Medication Sig Dispense Refill   atorvastatin (LIPITOR) 10 MG tablet Take 10 mg by mouth daily.     lisinopril (ZESTRIL) 5 MG tablet Take 5 mg by mouth daily.     loratadine (CLARITIN) 10 MG tablet Take 10 mg by mouth daily.     LORazepam  (ATIVAN ) 0.5 MG tablet 1 qhs  1 qday PRN 45 tablet 3   ARIPiprazole  (ABILIFY ) 2 MG tablet Take 1 tablet (2 mg total) by mouth daily. 30 tablet 4   Desloratadine-Pseudoephedrine (CLARINEX-D 12 HOUR PO) Take 1 tablet by mouth 2 (two) times daily. (Patient not taking: Reported on 02/07/2024)     lamoTRIgine  (LAMICTAL ) 25 MG tablet 2 qam for 2 weeks then 1 qam  for 2 weeks then Stop 45 tablet 0   venlafaxine  XR (EFFEXOR -XR) 150 MG 24 hr  capsule Take 2 capsules (300 mg total) by mouth daily with breakfast. 60 capsule 3   Current Facility-Administered Medications  Medication Dose Route Frequency Provider Last Rate Last Admin   0.9 %  sodium chloride  infusion  500 mL Intravenous Once Kenney Peacemaker, MD        Musculoskeletal: Strength & Muscle Tone: within normal limits Gait & Station: normal Patient leans: N/A  Psychiatric Specialty Exam: Review of Systems  Blood pressure (!) 142/78, pulse 92, height 5\' 4"  (1.626 m), weight 152 lb (68.9 kg), last menstrual period 12/17/2007.Body mass index is 26.09 kg/m.  General Appearance: Casual  Eye Contact:  Good  Speech:  Clear and CoherentNormal  Volume:    Mood:  NA  Affect:  NA  Thought Process:  Goal Directed  Orientation:  NA  Thought Content:  WDL  Suicidal Thoughts:  No  Homicidal Thoughts:  No  Memory:  NA  Judgement:  Good  Insight:  Fair  Psychomotor Activity:  Normal  Concentration:    Recall:  Good  Fund of Knowledge:Good  Language: Good  Akathisia:  No  Handed:  Right  AIMS (if indicated):  not done  Assets:  Desire for Improvement  ADL's:  Intact  Cognition: WNL  Sleep:  Good   Screenings: GAD-7    Flowsheet Row Counselor from 07/11/2023 in [facility] Video Visit from 12/14/2021 in [facility] Video Visit from 09/24/2021 in [facility] Video Visit from 06/18/2021 in [facility] Video Visit from 01/20/2021 in [facility]  Total GAD-7 Score 18 13 18 9 14       [phone]    Flowsheet Row Counselor from 07/11/2023 in [facility] Counselor from 12/28/2022 in [facility] from 06/28/2022 in [facility] Video Visit from 12/14/2021 in [facility] Video Visit from 09/24/2021  in [facility]  PHQ-2 Total Score 2 0 1 1 6   PHQ-9 Total Score 10 -- 8 4 13       Flowsheet Row Counselor from 07/11/2023 in [facility] Counselor from 03/07/2023 in [facility] [place] Counselor from 01/27/2023 in [facility] RISK CATEGORY Low Risk No Risk No Risk       Assessment and Plan:     Today to clarify by her symptoms seem to be very consistent with generalized anxiety disorder.  Her present diagnosis however is major depression.  Today we are going to increase her Effexor  from taking a dose of 225 mg to the highest dose of 300 mg.  This will also be relevant for her depression as well as possible generalized anxiety disorder.  She will continue taking 2 mg of Abilify .  Today we will taper her off the Lamictal .  She is presently taking 100 mg and she will reduce it to 25 mg taking 2 for 2 weeks and then 1 for 1 to 2 weeks.  When she sees me again in about 2-1/2 months he will be off Lamictal  and on a higher dose of Effexor .  Patient will continue in one-to-one therapy. Consent: Patient/Guardian gives verbal consent for treatment and assignment of benefits for services provided during this visit. Patient/Guardian expressed understanding and agreed to proceed.   Delorse Fey, MD 4/22/20252:55 PM

## 2024-02-14 ENCOUNTER — Ambulatory Visit (HOSPITAL_COMMUNITY): Admitting: Mental Health

## 2024-02-28 ENCOUNTER — Ambulatory Visit (INDEPENDENT_AMBULATORY_CARE_PROVIDER_SITE_OTHER): Admitting: Mental Health

## 2024-02-28 DIAGNOSIS — F411 Generalized anxiety disorder: Secondary | ICD-10-CM

## 2024-02-28 DIAGNOSIS — F32A Depression, unspecified: Secondary | ICD-10-CM

## 2024-02-28 NOTE — Progress Notes (Unsigned)
   THERAPIST PROGRESS NOTE Virtual Visit via Video Note  I connected with Cassandra Lynch on 02/28/24 at  2:00 PM EDT by a video enabled telemedicine application and verified that I am speaking with the correct person using two identifiers.  Location: Patient: home address on file Provider: office   I discussed the limitations of evaluation and management by telemedicine and the availability of in person appointments. The patient expressed understanding and agreed to proceed.  I discussed the assessment and treatment plan with the patient. The patient was provided an opportunity to ask questions and all were answered. The patient agreed with the plan and demonstrated an understanding of the instructions.   The patient was advised to call back or seek an in-person evaluation if the symptoms worsen or if the condition fails to improve as anticipated.  I provided 37 minutes of non-face-to-face time during this encounter.   Cassandra Lynch, [facility]  Session Time: 2:05 pm  Participation Level: Active  Behavioral Response: CasualAlertDysphoric  Type of Therapy: Individual Therapy  Treatment Goals addressed:  STG: "Emotional wreck." Cassandra Lynch will increase management of anxious thoughts AEB development of x 3 coping skills with ability to reframe anxious/worrisome thoughts daily as needed within the next 90 days.   ProgressTowards Goals: Progressing  Interventions: Supportive  Summary: Cassandra Lynch is a 68 y.o. female who presents with dx of major depression mild and generalized anxiety disorder. Presents with chief complaint of low mood and feelings of depression. Notes depression triggered by having to take down her covered patio and now is having workers repair siding where cover was removed. Notes to not feel as if she has her "safe space" and not able to set up outside as she would like. Notes also concerns for grand-son and relationship with an uncle of his. Shares thoughts on  upcoming trips to visit daughter and see her show, festival trip as well as concert she is going to over the summer. Able to process managing emotions with therapist and working to navigate relationship with sister. Shares worrisome thoughts of grand-son but is able to process with therapist and reports feeling better. Able to balance out thoughts with with support of therapist. Denies safety concerns. Ongoing progress with goals.  Suicidal/Homicidal: Nowithout intent/plan  Therapist Response:  Therapist engaged Cassandra Lynch in tele-therapy session. Completed check in and assessed for current level of functioning, sxs management and level of stressors. Provided safe space to share thoughts and feelings in regards to current feelings of depression. Provided support and feedback and validated feelings. Active empathic listening with encouragement. Explored working to process current feelings of depression and concern for grand-son. Explored best next steps and explored ability to rebuild a safe space for her outside and inside. Reviewed session and provided follow up.   Plan: Return again in  x 2 weeks.  Diagnosis: Generalized anxiety disorder  Mild depression  Collaboration of Care: Other None  Patient/Guardian was advised Release of Information must be obtained prior to any record release in order to collaborate their care with an outside provider. Patient/Guardian was advised if they have not already done so to contact the registration department to sign all necessary forms in order for us  to release information regarding their care.   Consent: Patient/Guardian gives verbal consent for treatment and assignment of benefits for services provided during this visit. Patient/Guardian expressed understanding and agreed to proceed.   Cassandra Lynch, [facility] 02/28/2024

## 2024-03-13 ENCOUNTER — Ambulatory Visit (INDEPENDENT_AMBULATORY_CARE_PROVIDER_SITE_OTHER): Admitting: Mental Health

## 2024-03-13 DIAGNOSIS — F411 Generalized anxiety disorder: Secondary | ICD-10-CM

## 2024-03-13 DIAGNOSIS — F32A Depression, unspecified: Secondary | ICD-10-CM
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
## 2024-04-04 ENCOUNTER — Ambulatory Visit (INDEPENDENT_AMBULATORY_CARE_PROVIDER_SITE_OTHER): Admitting: Mental Health

## 2024-04-04 DIAGNOSIS — F411 Generalized anxiety disorder: Secondary | ICD-10-CM | POA: Diagnosis not present

## 2024-04-04 DIAGNOSIS — F32A Depression, unspecified: Secondary | ICD-10-CM
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
## 2024-04-18 ENCOUNTER — Ambulatory Visit (HOSPITAL_COMMUNITY): Admitting: Psychiatry

## 2024-04-18 VITALS — BP 136/82 | HR 85 | Ht 64.0 in | Wt 150.0 lb

## 2024-04-18 DIAGNOSIS — F32A Depression, unspecified: Secondary | ICD-10-CM

## 2024-04-18 DIAGNOSIS — F411 Generalized anxiety disorder: Secondary | ICD-10-CM

## 2024-04-18 DIAGNOSIS — F325 Major depressive disorder, single episode, in full remission: Secondary | ICD-10-CM

## 2024-04-18 MED ORDER — VENLAFAXINE HCL ER 150 MG PO CP24: 300.0000 mg | ORAL_CAPSULE | Freq: Every day | ORAL | 3 refills | Status: DC

## 2024-04-18 MED ORDER — ARIPIPRAZOLE 2 MG PO TABS: 2.0000 mg | ORAL_TABLET | Freq: Every day | ORAL | 4 refills | Status: DC

## 2024-04-18 NOTE — Progress Notes (Signed)
 Psychiatric Initial Adult Assessment   Patient Identification: Cassandra Lynch MRN:  [id number] Date of Evaluation:  04/18/2024 Referral Source: [place] Chief Complaint: Depression   Today the patient is doing much much better.  Her mood is good.  She is off the Lamictal  without a problem and she thinks that is been good.  But more importantly the increase of Effexor  has had a significant effect from both her depression and her anxiety.  The patient is sleeping and eating well she is got good energy.  She enjoys music.  Her family and her are getting along much better.  She continues in one-to-one therapy.  She also takes 2 mg of Abilify .  Today we went over the warnings of tardive dyskinesia and she had an aims scale performed that showed no evidence of tardive dyskinesia.  She understood the warning of tardive dyskinesia.  In the past she has attempted to come off Abilify  2 times and declined into a deep depression.  She is committed to continue taking the Abilify  probably indefinitely.  She shares with me that her blood pressure and her physical status is much improved.  She feels good about her situation.   Visit Diagnosis:    ICD-10-CM   1. Mild depression  F32.A venlafaxine  XR (EFFEXOR -XR) 150 MG 24 hr capsule    2. Generalized anxiety disorder  F41.1 venlafaxine  XR (EFFEXOR -XR) 150 MG 24 hr capsule      History of Present Illness: See above  Associated Signs/Symptoms: Depression Symptoms:  depressed mood, (Hypo) Manic Symptoms:   Anxiety Symptoms:   Psychotic Symptoms:   PTSD Symptoms: NA  Past Psychiatric History: Multiple psychotropic medicines and psychotherapy  Previous Psychotropic Medications: Yes   Substance Abuse History in the last 12 months:  No.  Consequences of Substance Abuse: NA  Past Medical History:  Past Medical History:  Diagnosis Date   Abnormal Pap smear of cervix 09/17/2015   LSIL CIN1 HR HPV:+detected   Allergy    Anxiety    Depression     Fibroids 05/2000   Personal history of colonic adenomas 08/27/2013   Smoker     Past Surgical History:  Procedure Laterality Date   CESAREAN SECTION     x 2   CHOLECYSTECTOMY, LAPAROSCOPIC  1989   COLONOSCOPY     FINGER SURGERY Right 2007   pointer, severed tendons   POLYPECTOMY      Family Psychiatric History:   Family History:  Family History  Problem Relation Age of Onset   Colon polyps Father    Colon polyps Brother    Heart disease Brother    Heart disease Mother    Colon cancer Neg Hx    Esophageal cancer Neg Hx    Rectal cancer Neg Hx    Stomach cancer Neg Hx     Social History:   Social History   Socioeconomic History   Marital status: Divorced    Spouse name: Not on file   Number of children: 2   Years of education: Not on file   Highest education level: Not on file  Occupational History   Not on file  Tobacco Use   Smoking status: Every Day    Current packs/day: 1.00    Average packs/day: 1 pack/day for 30.0 years (30.0 ttl pk-yrs)    Types: Cigarettes   Smokeless tobacco: Never  Vaping Use   Vaping status: Never Used  Substance and Sexual Activity   Alcohol use: Yes    Alcohol/week: 1.0 standard  drink of alcohol    Types: 1 Glasses of wine per week    Comment: occasionally   Drug use: No   Sexual activity: Not Currently    Partners: Male    Birth control/protection: Post-menopausal  Other Topics Concern   Not on file  Social History Narrative   Not on file   Social Drivers of Health   Financial Resource Strain: Low Risk  (01/25/2024)   Received from [facility]   Overall Financial Resource Strain (CARDIA)    Difficulty of Paying Living Expenses: Not hard at all  Food Insecurity: No Food Insecurity (01/25/2024)   Received from [facility]   Hunger Vital Sign    Within the past 12 months, you worried that your food would run out before you got the money to buy more.: Never true    Within the past 12 months, the food you bought just  didn't last and you didn't have money to get more.: Never true  Transportation Needs: No Transportation Needs (01/25/2024)   Received from [facility] - Transportation    Lack of Transportation (Medical): No    Lack of Transportation (Non-Medical): No  Physical Activity: Sufficiently Active (01/25/2024)   Received from [facility]   Exercise Vital Sign    On average, how many days per week do you engage in moderate to strenuous exercise (like a brisk walk)?: 6 days    On average, how many minutes do you engage in exercise at this level?: 50 min  Stress: No Stress Concern Present (01/25/2024)   Received from [facility] - Occupational Stress Questionnaire    Feeling of Stress : Not at all  Social Connections: Moderately Integrated (01/25/2024)   Received from [facility]   Social Network    How would you rate your social network (family, work, friends)?: Adequate participation with social networks    Additional Social History:   Allergies:   Allergies  Allergen Reactions   Biaxin [Clarithromycin] Hives   Ceftin [Cefuroxime Axetil]    Vancomycin     Metabolic Disorder Labs: Lab Results  Component Value Date   HGBA1C 5.4 02/05/2020   MPG 103 11/12/2016   No results found for: PROLACTIN Lab Results  Component Value Date   CHOL 187 02/05/2020   TRIG 145 02/05/2020   HDL 52 02/05/2020   CHOLHDL 3.6 02/05/2020   VLDL 27 11/12/2016   LDLCALC 109 (H) 02/05/2020   LDLCALC 162 (H) 11/12/2016   Lab Results  Component Value Date   TSH 3.650 02/05/2020    Therapeutic Level Labs: No results found for: LITHIUM No results found for: CBMZ No results found for: VALPROATE  Current Medications: Current Outpatient Medications  Medication Sig Dispense Refill   ARIPiprazole  (ABILIFY ) 2 MG tablet Take 1 tablet (2 mg total) by mouth daily. 30 tablet 4   atorvastatin (LIPITOR) 10 MG tablet Take 10 mg by mouth daily.      Desloratadine-Pseudoephedrine (CLARINEX-D 12 HOUR PO) Take 1 tablet by mouth 2 (two) times daily. (Patient not taking: Reported on 02/07/2024)     lisinopril (ZESTRIL) 5 MG tablet Take 5 mg by mouth daily.     loratadine (CLARITIN) 10 MG tablet Take 10 mg by mouth daily.     LORazepam  (ATIVAN ) 0.5 MG tablet 1 qhs  1 qday PRN 45 tablet 3   venlafaxine  XR (EFFEXOR -XR) 150 MG 24 hr capsule Take 2 capsules (300 mg total) by mouth daily  with breakfast. 60 capsule 3   Current Facility-Administered Medications  Medication Dose Route Frequency Provider Last Rate Last Admin   0.9 %  sodium chloride  infusion  500 mL Intravenous Once Avram Lupita BRAVO, MD        Musculoskeletal: Strength & Muscle Tone: within normal limits Gait & Station: normal Patient leans: N/A  Psychiatric Specialty Exam: Review of Systems  Last menstrual period 12/17/2007.There is no height or weight on file to calculate BMI.  General Appearance: Casual  Eye Contact:  Good  Speech:  Clear and CoherentNormal  Volume:    Mood:  NA  Affect:  NA  Thought Process:  Goal Directed  Orientation:  NA  Thought Content:  WDL  Suicidal Thoughts:  No  Homicidal Thoughts:  No  Memory:  NA  Judgement:  Good  Insight:  Fair  Psychomotor Activity:  Normal  Concentration:    Recall:  Good  Fund of Knowledge:Good  Language: Good  Akathisia:  No  Handed:  Right  AIMS (if indicated):  not done  Assets:  Desire for Improvement  ADL's:  Intact  Cognition: WNL  Sleep:  Good   Screenings: GAD-7    Flowsheet Row Counselor from 07/11/2023 in [facility] Video Visit from 12/14/2021 in [facility] Video Visit from 09/24/2021 in [facility] Video Visit from 06/18/2021 in [facility] Video Visit from 01/20/2021 in [facility]  Total GAD-7 Score 18 13 18 9 14    PHQ2-9    Flowsheet Row Counselor from  07/11/2023 in [facility] Counselor from 12/28/2022 in [facility] from 06/28/2022 in [facility] Video Visit from 12/14/2021 in [facility] Video Visit from 09/24/2021 in [facility]  PHQ-2 Total Score 2 0 1 1 6   PHQ-9 Total Score 10 -- 8 4 13    Flowsheet Row Counselor from 07/11/2023 in [facility] Counselor from 03/07/2023 in [facility] at [place] Counselor from 01/27/2023 in [facility] at [facility] RISK CATEGORY Low Risk No Risk No Risk    Assessment and Plan:     This patient's diagnosis is mostly likely to be generalized anxiety disorder.  She does have a lot of depression symptoms but Effexor  seems to work well for her.  She also takes Abilify  2 mg.  This is consistent with a major depression disorder.  She is having a good response to these medications and most importantly she is also staying in one-to-one therapy.  He will return to see me in 3-1/2 months. Consent: Patient/Guardian gives verbal consent for treatment and assignment of benefits for services provided during this visit. Patient/Guardian expressed understanding and agreed to proceed.   Elna LILLETTE Lo, MD 7/2/20254:10 PM

## 2024-04-26 ENCOUNTER — Encounter (HOSPITAL_COMMUNITY): Payer: Self-pay

## 2024-04-26 ENCOUNTER — Ambulatory Visit (HOSPITAL_COMMUNITY): Admitting: Mental Health

## 2024-04-26 DIAGNOSIS — F32A Depression, unspecified: Secondary | ICD-10-CM | POA: Diagnosis not present

## 2024-04-26 DIAGNOSIS — F411 Generalized anxiety disorder: Secondary | ICD-10-CM

## 2024-04-26 NOTE — Progress Notes (Signed)
   THERAPIST PROGRESS NOTE Virtual Visit via Video Note  I connected with Evanny G Gulden on 04/26/24 at  4:00 PM EDT by a video enabled telemedicine application and verified that I am speaking with the correct person using two identifiers.  Location: Patient: [employer]  Provider: office    I discussed the limitations of evaluation and management by telemedicine and the availability of in person appointments. The patient expressed understanding and agreed to proceed.  I discussed the assessment and treatment plan with the patient. The patient was provided an opportunity to ask questions and all were answered. The patient agreed with the plan and demonstrated an understanding of the instructions.   The patient was advised to call back or seek an in-person evaluation if the symptoms worsen or if the condition fails to improve as anticipated.  I provided 30 minutes of non-face-to-face time during this encounter.   Ty Bernice Savant, [facility]   Session Time: 4:05 pm ( 30 minutes)  Participation Level: Active  Behavioral Response: Casual and Well GroomedAlertEuthymic  Type of Therapy: Individual Therapy  Treatment Goals addressed: STG: Emotional wreck. Terrye will increase management of anxious thoughts AEB development of x 3 coping skills with ability to reframe anxious/worrisome thoughts daily as needed within the next 90 days.   ProgressTowards Goals: Progressing  Interventions: Supportive  Summary:  KIERRAH KILBRIDE is a 68 y.o. female who presents with dx of major depression mild and generalized anxiety disorder. Presents alert and oriented; mood and affect euthymic; stable. Speech clear and coherent at normal rate and tone. Shares for moods to have been better and has been able to be mindful of her thoughts and engage in more positive thinking and giving herself grace. Shares thoughts on interaction with grand-children and thoughts on her discipline style vs. Her sons. Shares to  be medication complaint and feels medication change has been supportive. Shares has been able to regulate emotions better and less irritable with her dog. Noes plans to visit [place] in September. Shares plans to visit father today. Shares decreased anxiety and reduced worrisome thoughts. Denies safety concerns. Progress with goals.    Suicidal/Homicidal: Nowithout intent/plan  Therapist Response:   Therapist engaged Aicia in tele-therapy session. Completed check in and assessed for current level of functioning, sxs management and level of stressors. Provided safe space to share thoughts and feelings in regards to current interactions with son and  grand-children. Explored ability to communicate thoughts and feelings to son in a balanced manner and increased grace for self. Encouraged ongoing balanced thoughts and abiltiy to regulate emotions. Reviewed use of cognitive coping skills. Reviewed session and provided follow up  Plan: Return again in  x 4 weeks.  Diagnosis: Generalized anxiety disorder  Mild depression  Collaboration of Care: Other None  Patient/Guardian was advised Release of Information must be obtained prior to any record release in order to collaborate their care with an outside provider. Patient/Guardian was advised if they have not already done so to contact the registration department to sign all necessary forms in order for us  to release information regarding their care.   Consent: Patient/Guardian gives verbal consent for treatment and assignment of benefits for services provided during this visit. Patient/Guardian expressed understanding and agreed to proceed.   Ty Bernice Pyatt, [facility] 04/26/2024

## 2024-05-17 ENCOUNTER — Ambulatory Visit (INDEPENDENT_AMBULATORY_CARE_PROVIDER_SITE_OTHER): Admitting: Mental Health

## 2024-05-17 DIAGNOSIS — F411 Generalized anxiety disorder: Secondary | ICD-10-CM

## 2024-05-17 DIAGNOSIS — F32A Depression, unspecified: Secondary | ICD-10-CM

## 2024-05-17 NOTE — Progress Notes (Signed)
   THERAPIST PROGRESS NOTE Virtual Visit via Video Note  I connected with Cassandra Lynch on 05/17/24 at 11:00 AM EDT by a video enabled telemedicine application and verified that I am speaking with the correct person using two identifiers.  Location: Patient: home address on file Provider: office   I discussed the limitations of evaluation and management by telemedicine and the availability of in person appointments. The patient expressed understanding and agreed to proceed.  I discussed the assessment and treatment plan with the patient. The patient was provided an opportunity to ask questions and all were answered. The patient agreed with the plan and demonstrated an understanding of the instructions.   The patient was advised to call back or seek an in-person evaluation if the symptoms worsen or if the condition fails to improve as anticipated.  I provided 48 minutes of non-face-to-face time during this encounter.   Cassandra Lynch, [facility]   Session Time: 11:09 am (   Participation Level: Active  Behavioral Response: CasualAlertDysphoric  Type of Therapy: Individual Therapy  Treatment Goals addressed: STG: Emotional wreck. Cassandra Lynch will increase management of anxious thoughts AEB development of x 3 coping skills with ability to reframe anxious/worrisome thoughts daily as needed within the next 90 days.   ProgressTowards Goals: Progressing  Interventions: CBT and Supportive  Summary: Cassandra Lynch is a 68 y.o. female who presents with dx of major depression mild and generalized anxiety disorder. Presents alert and oriented; mood and affect adequate; stable. Speech clear and coherent at normal rate and tone. Shares for moods to have been better sharing to for them to be average. Notes some low mood secondary to concerns for aging. Explores with therapist dynamics of aging and loss of family and friends as well as concerns for declining health and mobility. 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 Shares concerns for family going to the beach and concerned they will be bit by a shark on caught in a rip tide. Explores with therapist working to reframe catastrophizing thoughts with challenging what's possible vs. What is likely and probable. Explores coping thoughts with therapist. Shares plans for the day. Takes medication during session. Denies safety concerns. Ongoing work towards goals with ongoing support needed to restructure thoughts.  Suicidal/Homicidal: Nowithout intent/plan  Therapist Response: Therapist engaged Cassandra Lynch in tele-therapy session. Completed check in and assessed for current level of functioning, sxs management and level of stressors. Provided safe space to share thoughts and feelings in regard to feelings of aging. Supported in process thoughts as well as normalizing thoughts of aging. Explored areas in which she continues to live full life. Reviewed ability to challenge anxious thoughts with socratic questioning and reviewed likelihood of events. Encouraged monitoring frequency of news. Reviewed session and provided follow up.   Plan: Return again in  x5 weeks.  Diagnosis: Mild depression  Generalized anxiety disorder  Collaboration of Care: Other None  Patient/Guardian was advised Release of Information must be obtained prior to any record release in order to collaborate their care with an outside provider. Patient/Guardian was advised if they have not already done so to contact the registration department to sign all necessary forms in order for us  to release information regarding their care.   Consent: Patient/Guardian gives verbal consent for treatment and assignment of benefits for services provided during this visit. Patient/Guardian expressed understanding and agreed to proceed.   Cassandra Lynch, [facility] 05/17/2024

## 2024-06-25 ENCOUNTER — Ambulatory Visit (INDEPENDENT_AMBULATORY_CARE_PROVIDER_SITE_OTHER): Admitting: Mental Health

## 2024-06-25 DIAGNOSIS — F32A Depression, unspecified: Secondary | ICD-10-CM

## 2024-06-25 DIAGNOSIS — F411 Generalized anxiety disorder: Secondary | ICD-10-CM

## 2024-06-25 NOTE — Progress Notes (Signed)
 THERAPIST PROGRESS NOTE Virtual Visit via Video Note  I connected with Cassandra Lynch on 06/25/24 at  3:00 PM EDT by a video enabled telemedicine application and verified that I am speaking with the correct person using two identifiers.  Location: Patient: home address on file Provider: remote office   I discussed the limitations of evaluation and management by telemedicine and the availability of in person appointments. The patient expressed understanding and agreed to proceed.  I discussed the assessment and treatment plan with the patient. The patient was provided an opportunity to ask questions and all were answered. The patient agreed with the plan and demonstrated an understanding of the instructions.   The patient was advised to call back or seek an in-person evaluation if the symptoms worsen or if the condition fails to improve as anticipated.  I provided 54 minutes of non-face-to-face time during this encounter.   Cassandra Lynch, [facility]   Session Time: 3:02 pm (   Participation Level: Active  Behavioral Response: CasualAlertEuthymic  Type of Therapy: Individual Therapy  Treatment Goals addressed: STG: Emotional wreck. Cassandra Lynch will increase management of anxious thoughts AEB development of x 3 coping skills with ability to reframe anxious/worrisome thoughts daily as needed within the next 90 days.   STG: Aging Cassandra Lynch will increase feelings of acceptance of age AEB reframe distortions surrounding age with ability to adjust to changes in level of functioning per self report within the next 90 days  Active     Anxiety         STG: Aging Cassandra Lynch will increase feelings of acceptance of age AEB reframe distortions surrounding age with ability to adjust to changes in level of functioning per self report within the next 90 days     Start:  06/25/24    Expected End:  08/23/24              OP Depression     LTG: Reduce frequency, intensity, and duration of  depression symptoms so that daily functioning is improved     Start:  03/13/24    Expected End:  08/23/24         STG: Emotional wreck. Cassandra Lynch will increase management of anxious thoughts AEB development of x 3 coping skills with ability to reframe anxious/worrisome thoughts daily as needed within the next 90 days.  (Progressing)     Start:  03/13/24    Expected End:  08/23/24      Update 06/25/24: GAD 6. Reports to feel as if she has increased ability to cope with emotions and feelings of anxiousness. Shares has been able to get more organized and and able to give self grace. Coping skills of coping thoughts, reframing maladaptive thinking and having something to look forward to (traveling trips) to be supportive in management of moods.      Goal Note     Update 06/25/24: GAD 6. Reports to feel as if she has increased ability to cope with emotions and feelings of anxiousness. Shares has been able to get more organized and and able to give self grace. Coping skills of coping thoughts, reframing maladaptive thinking and having something to look forward to (traveling trips) to be supportive in management of moods.          Therapist will administer the PHQ-9 at weekly intervals for the next 16 weeks     Start:  03/13/24         Work with Cassandra Lynch to identify the major components of  a recent episode of depression: physical symptoms, major thoughts and images, and major behaviors they experienced     Start:  03/13/24         Cassandra Lynch will identify  personal goals for managing depression symptoms to work on during the current treatment episode     Start:  03/13/24         Therapist will educate patient on cognitive distortions and the rationale for treatment of depression     Start:  03/13/24         Cassandra Lynch will identify  cognitive distortions they are currently using and write reframing statements to replace them     Start:  03/13/24         Therapist will review PLEASE Skills (Treat  Physical Illness, Balance Eating, Avoid Mood-Altering Substances, Balance Sleep and Get Exercise) with patient     Start:  03/13/24             ProgressTowards Goals: Progressing  Interventions: CBT and Supportive  Summary:  Cassandra Lynch is a 68 y.o. female who presents with dx of major depression mild and generalized anxiety disorder. Presents alert and oriented; mood and affect adequate; bright. Speech clear and coherent at normal rate and tone. Shares for moods to have been better sharing to for them to be I think stable. Shares to be doing well and feels as if emotions have been more stable. Explores with therapist progress made and shares ability to navigate worrisome thoughts with restructing how she processes events and situations. Shares upcoming trip planned to [place] and always enjoys having something to look forward to which supports in decreased depression. Shares thoughts of giving self grace,  I am retired, I can take an afternoon nap. Shares thoughts of continue to monitor worrisome thoughts an reports thoughts of mother passing and age. Notes thoughts on physical changes in body and thoughts on concern for dementia as she ages as concerns for her own mortality and independence. Agrees to updated treatment goals. Denies safety concerns.  Suicidal/Homicidal: Nowithout intent/plan  Therapist Response:  Therapist engaged Cassandra Lynch in tele-therapy session. Completed check in and assessed for current level of functioning, sxs management and level of stressors. Explored current level of functionign and progress with moods and emotional stability. Explored factors taht are supportive of stability. Reviewed and discussed PHQ and GAD scores. Provided safe space to share thoughts and feelings in regard to feelings of aging. Supported in process thoughts of physical changes as well as thoughts of mother and feelings of grief and possible avoidance of. Updated treatment plan and provided  followup      06/25/2024    3:30 PM 07/11/2023    1:12 PM 12/29/2022    2:03 PM 06/28/2022   10:37 AM 12/14/2021    3:11 PM  Depression screen PHQ 2/9  Decreased Interest 0 0 0 0 0  Down, Depressed, Hopeless 1 2 0 1 1  PHQ - 2 Score 1 2 0 1 1  Altered sleeping 0 0  1 0  Tired, decreased energy 2 0  0 1  Change in appetite 2 0  2 0  Feeling bad or failure about yourself  2 3  3 1   Trouble concentrating 1 2  1 1   Moving slowly or fidgety/restless 0 2  0 0  Suicidal thoughts 0 1  0 0  PHQ-9 Score 8 10  8 4   Difficult doing work/chores Not difficult at all Very difficult  Somewhat difficult Not difficult  at all      06/25/2024    3:20 PM 07/11/2023    1:11 PM 12/14/2021    3:12 PM 09/24/2021    1:32 PM  GAD 7 : Generalized Anxiety Score  Nervous, Anxious, on Edge 2 3 2 3   Control/stop worrying 1 3 3 3   Worry too much - different things 1 3 3 3   Trouble relaxing 0 2 2 2   Restless 0 2 0 1  Easily annoyed or irritable 1 2 1 3   Afraid - awful might happen 1 3 2 3   Total GAD 7 Score 6 18 13 18   Anxiety Difficulty Somewhat difficult Very difficult Somewhat difficult Very difficult      Plan: Return again in x 3 weeks.  Diagnosis: Mild depression  Generalized anxiety disorder  Collaboration of Care: Other None  Patient/Guardian was advised Release of Information must be obtained prior to any record release in order to collaborate their care with an outside provider. Patient/Guardian was advised if they have not already done so to contact the registration department to sign all necessary forms in order for us  to release information regarding their care.   Consent: Patient/Guardian gives verbal consent for treatment and assignment of benefits for services provided during this visit. Patient/Guardian expressed understanding and agreed to proceed.   Cassandra Asal [facility], [facility] 06/25/2024

## 2024-07-16 ENCOUNTER — Ambulatory Visit (INDEPENDENT_AMBULATORY_CARE_PROVIDER_SITE_OTHER): Admitting: Mental Health

## 2024-07-16 DIAGNOSIS — F32A Depression, unspecified: Secondary | ICD-10-CM

## 2024-07-16 DIAGNOSIS — F411 Generalized anxiety disorder: Secondary | ICD-10-CM | POA: Diagnosis not present

## 2024-07-16 NOTE — Progress Notes (Unsigned)
 THERAPIST PROGRESS NOTE Virtual Visit via Video Note  I connected with Cassandra Lynch on 07/16/24 at 11:00 AM EDT by a video enabled telemedicine application and verified that I am speaking with the correct person using two identifiers.  Location: Patient: home address on file Provider: remote office   I discussed the limitations of evaluation and management by telemedicine and the availability of in person appointments. The patient expressed understanding and agreed to proceed.  I discussed the assessment and treatment plan with the patient. The patient was provided an opportunity to ask questions and all were answered. The patient agreed with the plan and demonstrated an understanding of the instructions.   The patient was advised to call back or seek an in-person evaluation if the symptoms worsen or if the condition fails to improve as anticipated.  I provided 54 minutes of non-face-to-face time during this encounter.   Ty Bernice Savant, [facility]   Session Time: 11:02 am (   Participation Level: Active  Behavioral Response: CasualAlertWNL  Type of Therapy: Individual Therapy  Treatment Goals addressed:  STG: Emotional wreck. Cassandra Lynch will increase management of anxious thoughts AEB development of x 3 coping skills with ability to reframe anxious/worrisome thoughts daily as needed within the next 90 days.    STG: Aging Cassandra Lynch will increase feelings of acceptance of age AEB reframe distortions surrounding age with ability to adjust to changes in level of functioning per self report within the next 90 days  ProgressTowards Goals: Progressing  Interventions: CBT and Supportive  Summary: Cassandra Lynch is a 68 y.o. female who presents with dx of major depression mild and generalized anxiety disorder. Presents alert and oriented; mood and affect adequate; bright. Speech clear and coherent at normal rate and tone. Shares for moods to have been doing well until I got home. Shares  concerning visit to [place] and ability to attend a show, see friends and attend seeing a favorite band of hers. Shares interaction with friend who is homeless and reports concerns for her and desire to want to help. Shares difficulty ceasing thinking about her. Shares thoughts to have enjoyed self in [place] and feelings of boredom now that she has returned. Explores with therapist working to increase interaction at home and continuing to engage in activities in  which she finds adventurous. Notes with age wants to continue to be able to engage in life and explores thoughts of companionship and exploration of engaging with someone to do things with. Denies safety concerns, ongoing progress with goals. Increased emotional stability. Exploring concerns of aging and acceptance.   Suicidal/Homicidal: Nowithout intent/plan  Therapist Response: Therapist engaged [place] in tele-therapy session. Completed check in and assessed for current level of functioning, sxs management and level of stressors. Explored current level of functionign and progress with moods and emotional stability. Supported in processing recent events of presenting to [place] and enjoyment. Supported in processing concerns; validated feelings. Explored ability explored desire for new experiences and independence. Supported in exploring ways to increase feelings of adventure locally. Reviewed session and provided follow up  Plan: Return again in  x 4 weeks.  Diagnosis: Generalized anxiety disorder  Mild depression  Collaboration of Care: Other None  Patient/Guardian was advised Release of Information must be obtained prior to any record release in order to collaborate their care with an outside provider. Patient/Guardian was advised if they have not already done so to contact the registration department to sign all necessary forms in order for us  to release  information regarding their care.   Consent: Patient/Guardian gives verbal consent for treatment  and assignment of benefits for services provided during this visit. Patient/Guardian expressed understanding and agreed to proceed.   Ty Asal [place], [facility] 07/16/2024

## 2024-08-01 ENCOUNTER — Encounter (HOSPITAL_COMMUNITY): Payer: Self-pay

## 2024-08-01 ENCOUNTER — Ambulatory Visit (HOSPITAL_COMMUNITY): Admitting: Psychiatry

## 2024-08-06 ENCOUNTER — Ambulatory Visit (INDEPENDENT_AMBULATORY_CARE_PROVIDER_SITE_OTHER): Admitting: Mental Health

## 2024-08-06 DIAGNOSIS — F411 Generalized anxiety disorder: Secondary | ICD-10-CM | POA: Diagnosis not present

## 2024-08-06 DIAGNOSIS — F32A Depression, unspecified: Secondary | ICD-10-CM

## 2024-08-06 NOTE — Progress Notes (Unsigned)
   THERAPIST PROGRESS NOTE Virtual Visit via Video Note  I connected with Cassandra Lynch on 08/06/24 at  4:00 PM EDT by a video enabled telemedicine application and verified that I am speaking with the correct person using two identifiers.  Location: Patient: home address on file Provider: remote office   I discussed the limitations of evaluation and management by telemedicine and the availability of in person appointments. The patient expressed understanding and agreed to proceed.  I discussed the assessment and treatment plan with the patient. The patient was provided an opportunity to ask questions and all were answered. The patient agreed with the plan and demonstrated an understanding of the instructions.   The patient was advised to call back or seek an in-person evaluation if the symptoms worsen or if the condition fails to improve as anticipated.  I provided 50 minutes of non-face-to-face time during this encounter.   Ty Bernice Savant, [facility]   Session Time: 4:02 pm   Participation Level: Active  Behavioral Response: CasualAlertEuthymic  Type of Therapy: Individual Therapy  Treatment Goals addressed:  STG: Emotional wreck. Cassandra Lynch will increase management of anxious thoughts AEB development of x 3 coping skills with ability to reframe anxious/worrisome thoughts daily as needed within the next 90 days.    STG: Aging Cassandra Lynch will increase feelings of acceptance of age AEB reframe distortions surrounding age with ability to adjust to changes in level of functioning per self report within the next 90 days  ProgressTowards Goals: Progressing  Interventions: Supportive  Summary:  Cassandra Lynch is a 67 y.o. female who presents with dx of major depression mild and generalized anxiety disorder. Presents alert and oriented; mood and affect adequate; bright. Speech clear and coherent at normal rate and tone. Shares for moods to have been doing well until I got home. Shares  concerning visit to [place] and ability to attend a show, see friends and attend seeing a   Suicidal/Homicidal: Nowithout intent/plan  Therapist Response:  Therapist engaged Cassandra Lynch in tele-therapy session. Completed check in and assessed for current level of functioning, sxs management and level of stressors. Explored current level of functionign and progress with moods and emotional stability. Supported in processing recent events of presenting to [place] and enjoyment. Supported in processing concerns; validated feelings.   Plan: Return again in  x 3 weeks.  Diagnosis: Generalized anxiety disorder  Mild depression  Collaboration of Care: Other None  Patient/Guardian was advised Release of Information must be obtained prior to any record release in order to collaborate their care with an outside provider. Patient/Guardian was advised if they have not already done so to contact the registration department to sign all necessary forms in order for us  to release information regarding their care.   Consent: Patient/Guardian gives verbal consent for treatment and assignment of benefits for services provided during this visit. Patient/Guardian expressed understanding and agreed to proceed.   Ty Bernice Panola, [facility] 08/06/2024

## 2024-08-21 ENCOUNTER — Ambulatory Visit (HOSPITAL_COMMUNITY): Admitting: Psychiatry

## 2024-08-21 ENCOUNTER — Encounter (HOSPITAL_COMMUNITY): Payer: Self-pay | Admitting: Psychiatry

## 2024-08-21 ENCOUNTER — Other Ambulatory Visit: Payer: Self-pay

## 2024-08-21 VITALS — BP 115/65 | HR 101 | Ht 64.0 in | Wt 149.0 lb

## 2024-08-21 DIAGNOSIS — F32A Depression, unspecified: Secondary | ICD-10-CM

## 2024-08-21 DIAGNOSIS — F325 Major depressive disorder, single episode, in full remission: Secondary | ICD-10-CM

## 2024-08-21 DIAGNOSIS — F411 Generalized anxiety disorder: Secondary | ICD-10-CM | POA: Diagnosis not present

## 2024-08-21 MED ORDER — VENLAFAXINE HCL ER 150 MG PO CP24: 300.0000 mg | ORAL_CAPSULE | Freq: Every day | ORAL | 5 refills | Status: AC

## 2024-08-21 MED ORDER — ARIPIPRAZOLE 2 MG PO TABS: 2.0000 mg | ORAL_TABLET | Freq: Every day | ORAL | 4 refills | Status: AC

## 2024-08-21 NOTE — Progress Notes (Signed)
 Psychiatric Initial Adult Assessment   Patient Identification: Cassandra Lynch MRN:  [id number] Date of Evaluation:  08/21/2024 Referral Source: [facility] Chief Complaint: Depression    Today patient is seen in the office.  She is doing very well.  She [place] country.  Her father is doing great.  There was some issues between her and her sister and the father but that seems to be completely resolved.  The patient's mood is good.  Financially she is doing well.  Her health is good.  She has a dog and cat and she loves them.  Her grandchildren are doing well.  The patient is functioning extremely well.  Visit Diagnosis:    ICD-10-CM   1. Mild depression  F32.A venlafaxine  XR (EFFEXOR -XR) 150 MG 24 hr capsule    2. Generalized anxiety disorder  F41.1 venlafaxine  XR (EFFEXOR -XR) 150 MG 24 hr capsule      History of Present Illness: See above  Associated Signs/Symptoms: Depression Symptoms:  depressed mood, (Hypo) Manic Symptoms:   Anxiety Symptoms:   Psychotic Symptoms:   PTSD Symptoms: NA  Past Psychiatric History: Multiple psychotropic medicines and psychotherapy  Previous Psychotropic Medications: Yes   Substance Abuse History in the last 12 months:  No.  Consequences of Substance Abuse: NA  Past Medical History:  Past Medical History:  Diagnosis Date   Abnormal Pap smear of cervix 09/17/2015   LSIL CIN1 HR HPV:+detected   Allergy    Anxiety    Depression    Fibroids 05/2000   Personal history of colonic adenomas 08/27/2013   Smoker     Past Surgical History:  Procedure Laterality Date   CESAREAN SECTION     x 2   CHOLECYSTECTOMY, LAPAROSCOPIC  1989   COLONOSCOPY     FINGER SURGERY Right 2007   pointer, severed tendons   POLYPECTOMY      Family Psychiatric History:   Family History:  Family History  Problem Relation Age of Onset   Colon polyps Father    Colon polyps Brother    Heart disease Brother    Heart disease Mother     Colon cancer Neg Hx    Esophageal cancer Neg Hx    Rectal cancer Neg Hx    Stomach cancer Neg Hx     Social History:   Social History   Socioeconomic History   Marital status: Divorced    Spouse name: Not on file   Number of children: 2   Years of education: Not on file   Highest education level: Not on file  Occupational History   Not on file  Tobacco Use   Smoking status: Every Day    Current packs/day: 1.00    Average packs/day: 1 pack/day for 30.0 years (30.0 ttl pk-yrs)    Types: Cigarettes   Smokeless tobacco: Never  Vaping Use   Vaping status: Never Used  Substance and Sexual Activity   Alcohol use: Yes    Alcohol/week: 1.0 standard drink of alcohol    Types: 1 Glasses of wine per week    Comment: occasionally   Drug use: No   Sexual activity: Not Currently    Partners: Male    Birth control/protection: Post-menopausal  Other Topics Concern   Not on file  Social History Narrative   Not on file   Social Drivers of Health   Financial Resource Strain: Medium Risk (07/24/2024)   Received from [facility]   Overall Financial Resource Strain (CARDIA)  How hard is it for you to pay for the very basics like food, housing, medical care, and heating?: Somewhat hard  Food Insecurity: No Food Insecurity (07/24/2024)   Received from [facility]   Hunger Vital Sign    Within the past 12 months, you worried that your food would run out before you got the money to buy more.: Never true    Within the past 12 months, the food you bought just didn't last and you didn't have money to get more.: Never true  Transportation Needs: No Transportation Needs (07/24/2024)   Received from [facility] - Transportation    In the past 12 months, has lack of transportation kept you from medical appointments or from getting medications?: No    In the past 12 months, has lack of transportation kept you from meetings, work, or from getting things needed for daily living?: No   Physical Activity: Sufficiently Active (07/24/2024)   Received from [facility]   Exercise Vital Sign    On average, how many days per week do you engage in moderate to strenuous exercise (like a brisk walk)?: 3 days    On average, how many minutes do you engage in exercise at this level?: 50 min  Stress: No Stress Concern Present (07/24/2024)   Received from [facility] - Occupational Stress Questionnaire    Do you feel stress - tense, restless, nervous, or anxious, or unable to sleep at night because your mind is troubled all the time - these days?: Only a little  Social Connections: Moderately Integrated (07/24/2024)   Received from [facility]   Social Network    How would you rate your social network (family, work, friends)?: Adequate participation with social networks    Additional Social History:   Allergies:   Allergies  Allergen Reactions   Biaxin [Clarithromycin] Hives   Ceftin [Cefuroxime Axetil]    Vancomycin     Metabolic Disorder Labs: Lab Results  Component Value Date   HGBA1C 5.4 02/05/2020   MPG 103 11/12/2016   No results found for: PROLACTIN Lab Results  Component Value Date   CHOL 187 02/05/2020   TRIG 145 02/05/2020   HDL 52 02/05/2020   CHOLHDL 3.6 02/05/2020   VLDL 27 11/12/2016   LDLCALC 109 (H) 02/05/2020   LDLCALC 162 (H) 11/12/2016   Lab Results  Component Value Date   TSH 3.650 02/05/2020    Therapeutic Level Labs: No results found for: LITHIUM No results found for: CBMZ No results found for: VALPROATE  Current Medications: Current Outpatient Medications  Medication Sig Dispense Refill   atorvastatin (LIPITOR) 10 MG tablet Take 10 mg by mouth daily.     lisinopril (ZESTRIL) 5 MG tablet Take 5 mg by mouth daily.     loratadine (CLARITIN) 10 MG tablet Take 10 mg by mouth daily.     ARIPiprazole  (ABILIFY ) 2 MG tablet Take 1 tablet (2 mg total) by mouth daily. 30 tablet 4    Desloratadine-Pseudoephedrine (CLARINEX-D 12 HOUR PO) Take 1 tablet by mouth 2 (two) times daily. (Patient not taking: Reported on 02/07/2024)     venlafaxine  XR (EFFEXOR -XR) 150 MG 24 hr capsule Take 2 capsules (300 mg total) by mouth daily with breakfast. 60 capsule 5   Current Facility-Administered Medications  Medication Dose Route Frequency Provider Last Rate Last Admin   0.9 %  sodium chloride  infusion  500 mL Intravenous Once Gessner, Carl E, MD  Musculoskeletal: Strength & Muscle Tone: within normal limits Gait & Station: normal Patient leans: N/A  Psychiatric Specialty Exam: Review of Systems  Blood pressure 115/65, pulse (!) 101, height 5' 4 (1.626 m), weight 149 lb (67.6 kg), last menstrual period 12/17/2007.Body mass index is 25.58 kg/m.  General Appearance: Casual  Eye Contact:  Good  Speech:  Clear and CoherentNormal  Volume:    Mood:  NA  Affect:  NA  Thought Process:  Goal Directed  Orientation:  NA  Thought Content:  WDL  Suicidal Thoughts:  No  Homicidal Thoughts:  No  Memory:  NA  Judgement:  Good  Insight:  Fair  Psychomotor Activity:  Normal  Concentration:    Recall:  Good  Fund of Knowledge:Good  Language: Good  Akathisia:  No  Handed:  Right  AIMS (if indicated):  not done  Assets:  Desire for Improvement  ADL's:  Intact  Cognition: WNL  Sleep:  Good   Screenings: GAD-7    Flowsheet Row Counselor from 06/25/2024 in [facility] Counselor from 07/11/2023 in [facility] Video Visit from 12/14/2021 in [facility] Video Visit from 09/24/2021 in [facility] Video Visit from 06/18/2021 in [facility]  Total GAD-7 Score 6 18 13 18 9    PHQ2-9    Flowsheet Row Counselor from 06/25/2024 in [facility] Counselor from 07/11/2023 in [facility] Counselor from  12/28/2022 in [facility] from 06[facility]28/2022 in [facility] at [facility] Video Visit from 12/14/2021 in [facility]  PHQ-2 Total Score 1 2 0 1 1  PHQ-9 Total Score 8 10 -- 8 4   Flowsheet Row Counselor from 07/11/2023 in [facility] Counselor from 03/07/2023 in [facility] at [place] Counselor from 01/27/2023 in [facility] Health at [facility] RISK CATEGORY Low Risk No Risk No Risk    Assessment and Plan:    This patient's diagnosis is generalized anxiety disorder and also major depression in remission.  Presently the patient takes Effexor .  She will continue taking Abilify  2 mg which is the lowest effective dose for her.  Lower doses letter to be depressed.  Today she had an aims scale that showed no evidence of tardive dyskinesia.  She is very well aware of the side effect and is committed to taking Abilify  probably indefinitely.  Patient continues in one-to-one therapy with Geni.  She has a good relationship and therapy and is functioning very well.  She will return to see me in 6 months.  Elna LILLETTE Lo, MD 11/4/20254:32 PM

## 2024-08-27 ENCOUNTER — Ambulatory Visit (INDEPENDENT_AMBULATORY_CARE_PROVIDER_SITE_OTHER): Admitting: Mental Health

## 2024-08-27 DIAGNOSIS — F411 Generalized anxiety disorder: Secondary | ICD-10-CM

## 2024-08-27 NOTE — Progress Notes (Unsigned)
 THERAPIST PROGRESS NOTE Virtual Visit via Video Note  I connected with Cassandra Lynch on 08/27/24 at  3:00 PM EST by a video enabled telemedicine application and verified that I am speaking with the correct person using two identifiers.  Location: Patient: home address on file Provider: remote office   I discussed the limitations of evaluation and management by telemedicine and the availability of in person appointments. The patient expressed understanding and agreed to proceed.  I discussed the assessment and treatment plan with the patient. The patient was provided an opportunity to ask questions and all were answered. The patient agreed with the plan and demonstrated an understanding of the instructions.   The patient was advised to call back or seek an in-person evaluation if the symptoms worsen or if the condition fails to improve as anticipated.  I provided 46 minutes of non-face-to-face time during this encounter.   Ty Bernice Savant, [facility]   Session Time: 3:03pm  Participation Level: Active  Behavioral Response: CasualAlertadequate  Type of Therapy: Individual Therapy  Treatment Goals addressed:  STG: Emotional wreck. Cassandra Lynch will increase management of anxious thoughts AEB development of x 3 coping skills with ability to reframe anxious/worrisome thoughts daily as needed within the next 90 days.    STG: Cassandra Lynch will increase feelings of acceptance of age AEB reframe distortions surrounding age with ability to adjust to changes in level of functioning per self report within the next 90 days  ProgressTowards Goals: Progressing  Interventions: CBT and Supportive  Summary:   Cassandra Lynch is a 68 y.o. female who presents with dx of major depression mild and generalized anxiety disorder. Presents alert and oriented; mood and affect adequate. Speech clear and coherent at normal rate and tone. Shares for moods to have been doing well Shares thoughts on concern for  hx and ongoing inattention concerns. Shares can be forgetful, disorganized, difficult remaining focusedon one tasks and shares will have decision paralysis on what tasks she needs to work on and thus can decline to engage and complete anything. Shares for this to have also been a concern when she was working as she was known for disorganization. Shares thoughts on moving forward wth purchasing of land and notes thoughts on barrier to purchasing land in [place] and shares hx of events that cause hesitation. Shares for emotions to have been more manageable and working to continue to engage in life and adjusting to changes as she ages. Denies safety concerns    Suicidal/Homicidal: Nowithout intent/plan  Therapist Response: Therapist engaged Dannielle in tele-therapy session. Completed check in and assessed for current level of functioning, sxs management and level of stressors. Explored current level of functionign and progress with moods and emotional stability. Supported in processing desire to move to [place] and hx of friendship that gives her pause in moving forward. Supported in processing emotions and supported in challenging catastrophizing thoughts. Reviewed session and provided follow \\up   Plan: Return again in  x 4 weeks.  Diagnosis: Generalized anxiety disorder  Collaboration of Care: Other None  Patient/Guardian was advised Release of Information must be obtained prior to any record release in order to collaborate their care with an outside provider. Patient/Guardian was advised if they have not already done so to contact the registration department to sign all necessary forms in order for us  to release information regarding their care.   Consent: Patient/Guardian gives verbal consent for treatment and assignment of benefits for services provided during this visit. Patient/Guardian expressed understanding  and agreed to proceed.   Ty Asal Davey, [facility] 08/27/2024

## 2024-09-10 ENCOUNTER — Telehealth (HOSPITAL_COMMUNITY): Payer: Self-pay | Admitting: Mental Health

## 2024-09-10 NOTE — Telephone Encounter (Signed)
 Patient called in distress and wanted to speak with Bernice or make an appointment for today. She said she is having trouble with her bit** sister and you would know what that meant. She is ruining Thanksgiving. Patient is also upset about the phone tree and how hard it was to get through. I offered patient the number for the office, but she did not have anything to write with. Please call.  Please advise. Thank you.

## 2024-09-10 NOTE — Telephone Encounter (Signed)
 Therapist contacted pt and reviewed current concerns. Provided support and reviewed crisis services. Denies SI/HI and reports recent discord with sister causing distress. Notes has been able to self-soothe and calm at time of contact. Reviewed front desk direct number and mobile crisis services.  Pt has follow up OPT 12/9 @ 2pm

## 2024-09-10 NOTE — Telephone Encounter (Signed)
 12:52pm. Therapist contacted pt following reported distress with sister. No answer, left HIPAA complaint voicemail to return and therapist would make further attempt to follow up.

## 2024-09-25 ENCOUNTER — Ambulatory Visit (INDEPENDENT_AMBULATORY_CARE_PROVIDER_SITE_OTHER): Admitting: Mental Health

## 2024-09-25 DIAGNOSIS — F411 Generalized anxiety disorder: Secondary | ICD-10-CM

## 2024-09-25 DIAGNOSIS — F32A Depression, unspecified: Secondary | ICD-10-CM

## 2024-09-25 NOTE — Progress Notes (Signed)
° °  THERAPIST PROGRESS NOTE Virtual Visit via Video Note  I connected with Cassandra Lynch on 09/25/24 at  2:00 PM EST by a video enabled telemedicine application and verified that I am speaking with the correct person using two identifiers.  Location: Patient: home address on file Provider: office   I discussed the limitations of evaluation and management by telemedicine and the availability of in person appointments. The patient expressed understanding and agreed to proceed.  I discussed the assessment and treatment plan with the patient. The patient was provided an opportunity to ask questions and all were answered. The patient agreed with the plan and demonstrated an understanding of the instructions.   The patient was advised to call back or seek an in-person evaluation if the symptoms worsen or if the condition fails to improve as anticipated.  I provided 47 minutes of non-face-to-face time during this encounter.   Cassandra Lynch, [facility]   Session Time: 2:04 pm   Participation Level: Active  Behavioral Response: Casual and Well GroomedAlertAnxious  Type of Therapy: Individual Therapy  Treatment Goals addressed: STG: Emotional wreck. Cassandra Lynch will increase management of anxious thoughts AEB development of x 3 coping skills with ability to reframe anxious/worrisome thoughts daily as needed within the next 90 days.    STG: Aging Hope will increase feelings of acceptance of age AEB reframe distortions surrounding age with ability to adjust to changes in level of functioning per self report within the next 90 days  ProgressTowards Goals: Progressing  Interventions: CBT and Supportive  Summary: Cassandra Lynch is a 68 y.o. female who presents with dx of major depression mild and generalized anxiety disorder. Presents alert and oriented; mood and affect low anxious. Speech clear and coherent at normal rate and tone. Shares for moods to have been more depressed Shares thoughts  on current status of relationship with sister and shares with therapist recent events. Notes ability to set in place boundaries with sister and feelings as if she lost a friend. Shares thoughts of increasing age desire to maintain her mobility. Explores with therapist exercises she would like to engage in to support in maintain mobility and ability to live independently. Notes additional concerns of focus and ability to complete tasks. Denies safety concerns ongoing work towards goals.   Suicidal/Homicidal: Nowithout intent/plan  Therapist Response:  Therapist engaged Cassandra Lynch in tele-therapy session. Completed check in and assessed for current level of functioning, sxs management and level of stressors. Explored current level of functionign and stability in moods. Supported in processing relationship with sister and effects on anxiety and low mood. Explores ability to cope and navigate feelings. Explores presence of additional supports. Supported in processing thoughts on aging. Reviewed session.   Plan: Return again in  x 5 weeks.  Diagnosis: Generalized anxiety disorder  Mild depression  Collaboration of Care: Other None  Patient/Guardian was advised Release of Information must be obtained prior to any record release in order to collaborate their care with an outside provider. Patient/Guardian was advised if they have not already done so to contact the registration department to sign all necessary forms in order for us  to release information regarding their care.   Consent: Patient/Guardian gives verbal consent for treatment and assignment of benefits for services provided during this visit. Patient/Guardian expressed understanding and agreed to proceed.   Cassandra Lynch, [facility] 09/25/2024

## 2024-10-22 ENCOUNTER — Ambulatory Visit (INDEPENDENT_AMBULATORY_CARE_PROVIDER_SITE_OTHER): Admitting: Mental Health

## 2024-10-22 DIAGNOSIS — F411 Generalized anxiety disorder: Secondary | ICD-10-CM

## 2024-10-22 DIAGNOSIS — F32A Depression, unspecified: Secondary | ICD-10-CM

## 2024-10-22 NOTE — Progress Notes (Signed)
 "  THERAPIST PROGRESS NOTE Virtual Visit via Video Note  I connected with Terika G [place] on 10/22/2024 at  2:00 PM EST by a video enabled telemedicine application and verified that I am speaking with the correct person using two identifiers.  Location: Patient: home address on file Provider: remote office   I discussed the limitations of evaluation and management by telemedicine and the availability of in person appointments. The patient expressed understanding and agreed to proceed.  I discussed the assessment and treatment plan with the patient. The patient was provided an opportunity to ask questions and all were answered. The patient agreed with the plan and demonstrated an understanding of the instructions.   The patient was advised to call back or seek an in-person evaluation if the symptoms worsen or if the condition fails to improve as anticipated.  I provided 47 minutes of non-face-to-face time during this encounter.   Ty Bernice Savant, [facility]   Session Time: 2:01 pm ( 48 minutes)  Participation Level: Active  Behavioral Response: CasualAlertDysphoric  Type of Therapy: Individual Therapy  Treatment Goals addressed: STG: Emotional wreck. Lakia will increase management of anxious thoughts AEB development of x 3 coping skills with ability to reframe anxious/worrisome thoughts daily as needed within the next 90 days.    STG: Aging Bellany will increase feelings of acceptance of age AEB reframe distortions surrounding age with ability to adjust to changes in level of functioning per self report within the next 90 days  ProgressTowards Goals: Progressing  Interventions: CBT and Supportive  Summary: KEAYRA GRAHAM is a 69 y.o. female who presents with dx of major depression mild and generalized anxiety disorder. Presents alert and oriented; mood and affect low. Speech clear and coherent at normal rate and tone. Shares for moods to have been lower, noting challenges with the  holidays and lack of speaking to sister. Notes  I feel so old. Explores with therapist concerns for holiday time as well as thought son aging and level of functioning and body changes. Explores with therapist working to explore options to do things differently/changes to support in increased mood and activity. Explores environmental changes as well as activities to look forward to as this has historically been helpful for her. Shares with therapist upcoming plans. Denies safety concerns ongoing progress with moods.  Suicidal/Homicidal: Nowithout intent/plan  Therapist Response: Therapist engaged Murrel in tele-therapy session. Completed check in and assessed for current level of functioning, sxs management and level of stressors. Explored current level of functioning and stability in moods. Supported in processing recent events of holidays and reported challenges. Explores engagement in other natural supports and connection to others. Supported in processing thoughts of aging and values Rella holds in daily life. Explored ways in which she can continue to engage in life in a manner that is meaningful for her and encouraged ongoing activity. Encouraged daily adequate nutrition, mobility and sleep. Reviewed session and provided follow up.   Plan: Return again in  x 4 weeks.  Diagnosis: Generalized anxiety disorder  Mild depression  Collaboration of Care: Other None  Patient/Guardian was advised Release of Information must be obtained prior to any record release in order to collaborate their care with an outside provider. Patient/Guardian was advised if they have not already done so to contact the registration department to sign all necessary forms in order for us  to release information regarding their care.   Consent: Patient/Guardian gives verbal consent for treatment and assignment of benefits for services provided during  this visit. Patient/Guardian expressed understanding and agreed to proceed.    Ty Asal [place], [facility] 10/22/2024  "

## 2024-11-19 ENCOUNTER — Encounter (HOSPITAL_COMMUNITY): Payer: Self-pay

## 2024-11-19 ENCOUNTER — Ambulatory Visit (INDEPENDENT_AMBULATORY_CARE_PROVIDER_SITE_OTHER): Admitting: Mental Health

## 2024-11-19 DIAGNOSIS — F32A Depression, unspecified: Secondary | ICD-10-CM | POA: Diagnosis not present

## 2024-11-19 DIAGNOSIS — F411 Generalized anxiety disorder: Secondary | ICD-10-CM

## 2024-11-19 NOTE — Progress Notes (Unsigned)
 "  THERAPIST PROGRESS NOTE Virtual Visit via Video Note  I connected with Cassandra Lynch on 11/19/24 at 11:00 AM EST by a video enabled telemedicine application and verified that I am speaking with the correct person using two identifiers.  Location: Patient: home address on file Provider: remote office   I discussed the limitations of evaluation and management by telemedicine and the availability of in person appointments. The patient expressed understanding and agreed to proceed.  I discussed the assessment and treatment plan with the patient. The patient was provided an opportunity to ask questions and all were answered. The patient agreed with the plan and demonstrated an understanding of the instructions.   The patient was advised to call back or seek an in-person evaluation if the symptoms worsen or if the condition fails to improve as anticipated.  I provided 50 minutes of non-face-to-face time during this encounter.   Cassandra Lynch, [facility]   Session Time: 11:04 am ( 50 minutes)  Participation Level: Active  Behavioral Response: CasualAlertDepressed and Dysphoric  Type of Therapy: Individual Therapy  Treatment Goals addressed: STG: Aging Cassandra Lynch will increase feelings of acceptance of age AEB reframe distortions surrounding age with ability to adjust to changes in level of functioning per self report within the next 90 days  STG: Cassandra Lynch will increase stability in moods AEB daily medication compliance and x 3 emotional regulation skills within the next 90 days.   ProgressTowards Goals: Progressing  Interventions: CBT and Supportive  Summary: Cassandra Lynch is a 69 y.o. female who presents with dx of major depression mild and generalized anxiety disorder. Presents alert and oriented; mood and affect low,tearful. Speech clear and coherent at normal rate and tone. Shares for moods to have been lower, noting feelings of lonely, not able to leave the house due to weather and  not feeling physically well. Shares thoughts on lack of relationship with sister and maintain boundaries she has put in place. Explores options to increase interactions owith othes and in the community. Shares thoughts of  I feel old. Expresses thoughts on aging and thoughts of being forgotten. Identifies ways to increase sense of community and communicating concerns to son. Identified events in which she is looking forward. Takes medication during session and reports increased mood. Denies safety concerns. Agrees to updated treatment goals.   Suicidal/Homicidal: Nowithout intent/plan  Therapist Response:  Therapist engaged Cassandra Lynch in tele-therapy session. Completed check in and assessed for current level of functioning, sxs management and level of stressors. Explored current level of functioning and stability in moods. Supported in processing recent events of weather events and effects it has had on her health. Provided safe space to share thoughts and concerns of aging and engaging in life that is meaningful for her. Supported in identnifying supports in life and ability to engage with natural supports and engage in community. Supported in reframing distortions in thoughts and educated on manner in which feeling ill can effect mood and tolerance of emotions. Encouraged taking medication and adequate nutrition. Reviewed session. Annual txt plan.  Will complete updated CCA  Plan: Return again in  x 4 weeks.  Diagnosis: Generalized anxiety disorder  Mild depression  Collaboration of Care: Other None  Patient/Guardian was advised Release of Information must be obtained prior to any record release in order to collaborate their care with an outside provider. Patient/Guardian was advised if they have not already done so to contact the registration department to sign all necessary forms in order for us   to release information regarding their care.   Consent: Patient/Guardian gives verbal consent for  treatment and assignment of benefits for services provided during this visit. Patient/Guardian expressed understanding and agreed to proceed.   Cassandra Cassandra Lynch, [facility] 11/19/2024  "

## 2024-12-17 ENCOUNTER — Ambulatory Visit (HOSPITAL_COMMUNITY): Admitting: Mental Health

## 2025-01-07 ENCOUNTER — Ambulatory Visit (HOSPITAL_COMMUNITY): Admitting: Mental Health

## 2025-02-19 ENCOUNTER — Ambulatory Visit (HOSPITAL_COMMUNITY): Admitting: Psychiatry
